# Patient Record
Sex: Female | Born: 1955 | Race: White | Hispanic: No | Smoking: Current every day smoker
Health system: Southern US, Community
[De-identification: ages and names within clinical notes are randomized; demographics above are authoritative.]

---

## 2019-11-13 ENCOUNTER — Other Ambulatory Visit: Payer: Self-pay

## 2019-11-13 ENCOUNTER — Emergency Department
Admission: EM | Admit: 2019-11-13 | Discharge: 2019-11-14 | Disposition: A | Discharge status: Other Acute Inpatient Hospital | Payer: No Typology Code available for payment source | Attending: Emergency Medicine | Admitting: Emergency Medicine

## 2019-11-13 DIAGNOSIS — F332 Major depressive disorder, recurrent severe without psychotic features: Secondary | ICD-10-CM | POA: Diagnosis not present

## 2019-11-13 DIAGNOSIS — R45851 Suicidal ideations: Secondary | ICD-10-CM | POA: Insufficient documentation

## 2019-11-13 DIAGNOSIS — Z20822 Contact with and (suspected) exposure to covid-19: Secondary | ICD-10-CM | POA: Insufficient documentation

## 2019-11-13 DIAGNOSIS — F419 Anxiety disorder, unspecified: Secondary | ICD-10-CM | POA: Insufficient documentation

## 2019-11-13 DIAGNOSIS — F329 Major depressive disorder, single episode, unspecified: Secondary | ICD-10-CM | POA: Insufficient documentation

## 2019-11-13 DIAGNOSIS — F32A Depression, unspecified: Secondary | ICD-10-CM

## 2019-11-13 LAB — SARS CORONAVIRUS 2 BY RT PCR (HOSPITAL ORDER, PERFORMED IN ~~LOC~~ HOSPITAL LAB): SARS Coronavirus 2: NEGATIVE

## 2019-11-13 LAB — COMPREHENSIVE METABOLIC PANEL
ALT: 11 U/L (ref 0–44)
AST: 15 U/L (ref 15–41)
Albumin: 4.9 g/dL (ref 3.5–5.0)
Alkaline Phosphatase: 57 U/L (ref 38–126)
Anion gap: 11 (ref 5–15)
BUN: 8 mg/dL (ref 8–23)
CO2: 22 mmol/L (ref 22–32)
Calcium: 9.4 mg/dL (ref 8.9–10.3)
Chloride: 102 mmol/L (ref 98–111)
Creatinine, Ser: 0.75 mg/dL (ref 0.44–1.00)
GFR calc Af Amer: >60 mL/min (ref >60)
GFR calc non Af Amer: >60 mL/min (ref >60)
Glucose, Bld: 104 mg/dL — ABNORMAL HIGH (ref 70–99)
Potassium: 3.7 mmol/L (ref 3.5–5.1)
Sodium: 135 mmol/L (ref 135–145)
Total Bilirubin: 0.8 mg/dL (ref 0.3–1.2)
Total Protein: 8.1 g/dL (ref 6.5–8.1)

## 2019-11-13 LAB — CBC
HCT: 42.1 % (ref 36.0–46.0)
Hemoglobin: 14.7 g/dL (ref 12.0–15.0)
MCH: 31.5 pg (ref 26.0–34.0)
MCHC: 34.9 g/dL (ref 30.0–36.0)
MCV: 90.3 fL (ref 80.0–100.0)
Platelets: 284 10*3/uL (ref 150–400)
RBC: 4.66 MIL/uL (ref 3.87–5.11)
RDW: 13.3 % (ref 11.5–15.5)
WBC: 7.1 10*3/uL (ref 4.0–10.5)
nRBC: 0 % (ref 0.0–0.2)

## 2019-11-13 LAB — ACETAMINOPHEN LEVEL: Acetaminophen (Tylenol), Serum: <10 ug/mL — ABNORMAL LOW (ref 10–30)

## 2019-11-13 LAB — SALICYLATE LEVEL: Salicylate Lvl: <7 mg/dL — ABNORMAL LOW (ref 7.0–30.0)

## 2019-11-13 LAB — ETHANOL: Alcohol, Ethyl (B): <10 mg/dL (ref <10)

## 2019-11-13 MED ORDER — FLUOXETINE HCL 20 MG PO CAPS
40.0000 mg | ORAL_CAPSULE | Freq: Every day | ORAL | Status: DC
  Administered 2019-11-13 – 2019-11-14 (×2): 40 mg via ORAL
  Filled 2019-11-13 (×2): qty 2

## 2019-11-13 NOTE — ED Notes (Signed)
Pt denies HI/AVH but endorses SI but no plan. States she has been feeling increasingly overwhelmed since she came here to help out her son. Pt reports she is from [place]. Reports hx of depression but has not been taking meds and did admit that the medications she last received from the [place], she threw them out. Pt very anxious on assessment. Awaiting psych consult.

## 2019-11-13 NOTE — ED Notes (Signed)
Belongings include gray tshirt, white pants, two flip flops, one bra, one pair underwear, one purse with phone, wallet, keys inside. Pt has one pair glasses that are staying with patient.

## 2019-11-13 NOTE — Consult Note (Signed)
[facility] Face-to-Face [facility] Consult   Reason for Consult:  Suicidal ideations Referring Physician:  EDP Patient Identification: Terri Chandler MRN:  [id number] Principal Diagnosis: Major depressive disorder, recurrent severe without psychotic features (HCC) Diagnosis:  Principal Problem:   Major depressive disorder, recurrent severe without psychotic features (HCC)  Total Time spent with patient: 45 minutes  Subjective:   Terri Chandler is a 64 y.o. female patient admitted with suicidal ideations and plan to overdose.  Patient seen and evaluated in person by this provider.  She reports her depression became worse over the past week to the point of suicidal ideations with a plan to overdose.  Past overdose attempts with [facility] in [place] and [place].  She moved to  last week and is living in a hospital.  Stressed with her son's break up of his relationship with a girlfriend which made her depression worse as she cannot deal with it.  Memory is poor at times, repetitive on assessment at times.  Tearful with feelings of hopelessness, helplessness, and worthlessness.  No homicidal ideations, hallucinations, substance use, mania, or other associated factors.  A[facility]able [facility] or elsewhere.  Depression 10/10 and is worse at night.  HPI per [facility], Jasmine Faulcon: Terri Chandler is an 64 y.o. female. Pt presented to the ED voluntarily, with c/o SI without a plan.  Patient was severely anxious, with a flat affect upon interview. When asked what brought her to the ED the patient stated, "Everything in my life's been uprooted. I ended up here." Patient explained that she'd moved to [facility] from [place] a couple of weeks ago and currently lives in a hotel with her son, her dog, and her cat.Patient reported an extensive hx of depression and previous intentional overdoses that resulted in her being hospitalized. The patient continued to ruminate about  her son and his girlfriend breaking up throughout the interview. The patient reported that she stopped taking her depression medications due to being overwhelmed with her son's situation. The patient became visibly distressed while explaining her current circumstances. The patient expressed her need to contact her son to let him know where she was but could not remember his phone number. The patient expanded by reporting that her memory has not been intact lately. Patient endorsed SI however she stated she did not have a specific plan to harm herself. The patient expressed that she is experiencing feelings of overwhelm, hopelessness, and feelings of embarrassment about her situation. Denies any hallucinations, homicidal ideations, or paranoia. The patient agreed with being admitted inpatient and stated that she needs help before things get worse.   Past Psychiatric History: depression, anxiety  Risk to Self:  yes Risk to Others:  none [facility]  Past Medical History: History reviewed. No pertinent past medical history.  Family History: History reviewed. No pertinent family history. Family Psychiatric  History: None Social History:  Social History   Substance and Sexual Activity  Alcohol Use None     Social History   Substance and Sexual Activity  Drug Use Not on file    Social History   Socioeconomic History  . Marital status: Not on file    Spouse name: Not on file  . Number of children: Not on file  . Years of education: Not on file  . Highest education level: Not on file  Occupational History  . Not on file  Tobacco Use  . Smoking status:  Not on file  Substance and Sexual Activity  . Alcohol use: Not on file  . Drug use: Not on file  . Sexual activity: Not on file  Other Topics Concern  . Not on file  Social History Narrative  . Not on file   Social Determinants of Health   Financial Resource Strain:   . Difficulty of  Paying Living Expenses:   Food Insecurity:   . Worried About Programme researcher, broadcasting/film/video in the Last Year:   . Barista in the Last Year:   Transportation Needs:   . Freight forwarder (Medical):   Marland Kitchen Lack of Transportation (Non-Medical):   Physical Activity:   . Days of Exercise per Week:   . Minutes of Exercise per Session:   Stress:   . Feeling of Stress :   Social Connections:   . Frequency of Communication with Friends and Family:   . Frequency of Social Gatherings with Friends and Family:   . Attends Religious Services:   . Active Member of Clubs or Organizations:   . Attends Banker Meetings:   Marland Kitchen Marital Status:    Additional Social History:    Allergies:  Not on File  Labs:  Results for orders placed or performed during the hospital encounter of 11/13/19 (from the past 48 hour(s))  Comprehensive metabolic panel     Status: Abnormal   Collection Time: 11/13/19  4:04 PM  Result Value Ref Range   Sodium 135 135 - 145 mmol/L   Potassium 3.7 3.5 - 5.1 mmol/L   Chloride 102 98 - 111 mmol/L   CO2 22 22 - 32 mmol/L   Glucose, Bld 104 (H) 70 - 99 mg/dL    Comment: Glucose reference range applies only to samples taken after fasting for at least 8 hours.   BUN 8 8 - 23 mg/dL   Creatinine, Ser 0.73 0.44 - 1.00 mg/dL   Calcium 9.4 8.9 - 71.0 mg/dL   Total Protein 8.1 6.5 - 8.1 g/dL   Albumin 4.9 3.5 - 5.0 g/dL   AST 15 15 - 41 U/L   ALT 11 0 - 44 U/L   Alkaline Phosphatase 57 38 - 126 U/L   Total Bilirubin 0.8 0.3 - 1.2 mg/dL   GFR calc non Af Amer >60 >60 mL/min   GFR calc Af Amer >60 >60 mL/min   Anion gap 11 5 - 15    Comment: Performed at [facility], [address], [place], [place] [address]  Ethanol     Status: None   Collection Time: 11/13/19  4:04 PM  Result Value Ref Range   Alcohol, Ethyl (B) <10 <10 mg/dL    Comment: (NOTE) Lowest detectable limit for serum alcohol is 10 mg/dL.  For medical purposes only. Performed at [facility], [address] [place], [place] [address]   Salicylate level     Status: Abnormal   Collection Time: 11/13/19  4:04 PM  Result Value Ref Range   Salicylate Lvl <7.0 (L) 7.0 - 30.0 mg/dL    Comment: Performed at [facility], [address] [place], [place] [address]  Acetaminophen level     Status: Abnormal   Collection Time: 11/13/19  4:04 PM  Result Value Ref Range   Acetaminophen (Tylenol), Serum <10 (L) 10 - 30 ug/mL    Comment: (NOTE) Therapeutic concentrations vary significantly. A range of 10-30 ug/mL  may be an effective concentration for many patients. However,  some  are best treated at concentrations outside of this range. Acetaminophen concentrations >150 ug/mL at 4 hours after ingestion  and >50 ug/mL at 12 hours after ingestion are often associated with  toxic reactions.  Performed at [facility], [address] [place], [place] [address]   cbc     Status: None   Collection Time: 11/13/19  4:04 PM  Result Value Ref Range   WBC 7.1 4.0 - 10.5 K/uL   RBC 4.66 3.87 - 5.11 MIL/uL   Hemoglobin 14.7 12.0 - 15.0 g/dL   HCT 35.5 36 - 46 %   MCV 90.3 80.0 - 100.0 fL   MCH 31.5 26.0 - 34.0 pg   MCHC 34.9 30.0 - 36.0 g/dL   RDW 97.4 16.3 - 84.5 %   Platelets 284 150 - 400 K/uL   nRBC 0.0 0.0 - 0.2 %    Comment: Performed at [facility], [address] [place], [place] [address]    No current facility-administered medications for this encounter.   No current outpatient medications on file.    Musculoskeletal: Strength & Muscle Tone: within normal limits Gait & Station: normal Patient leans: N/A  Psychiatric Specialty Exam: Physical Exam Vitals and nursing note reviewed.  Constitutional:      Appearance: Normal appearance.  HENT:     Head: Normocephalic.     Nose: Nose normal.  Pulmonary:     Effort: Pulmonary effort is normal.  Musculoskeletal:        General: Normal range of motion.     Cervical back:  Normal range of motion.  Neurological:     General: No focal deficit present.     Mental Status: She is alert and oriented to person, place, and time.  Psychiatric:        Attention and Perception: Attention normal.        Mood and Affect: Mood is anxious and depressed.        Speech: Speech normal.        Behavior: Behavior normal. Behavior is cooperative.        Thought Content: Thought content includes suicidal ideation. Thought content includes suicidal plan.        Cognition and Memory: Cognition and memory normal.        Judgment: Judgment normal.     Review of Systems  Psychiatric/Behavioral: Positive for dysphoric mood and suicidal ideas.  All other systems reviewed and are negative.   Blood pressure 95/68, pulse 89, temperature 99.1 F (37.3 C), temperature source Oral, resp. rate 20, height 5\' 7"  (1.702 m), weight 61.2 kg, SpO2 97 %.Body mass index is 21.14 kg/m.  General Appearance: Casual  Eye Contact:  Fair  Speech:  Normal Rate  Volume:  Normal  Mood:  Anxious and Depressed  Affect:  Congruent  Thought Process:  Coherent and Descriptions of Associations: Intact  Orientation:  Full (Time, Place, and Person)  Thought Content:  Rumination  Suicidal Thoughts:  Yes.  with intent/plan  Homicidal Thoughts:  No  Memory:  Immediate;   Fair Recent;   Poor Remote;   Fair  Judgement:  Fair  Insight:  Fair  Psychomotor Activity:  Decreased  Concentration:  Concentration: Fair and Attention Span: Fair  Recall:  of Knowledge:  Fair  Language:  Good  Akathisia:  No  Handed:  Right  AIMS (if indicated):     Assets:  Leisure Time Physical Health Resilience Social Support  ADL's:  Intact  Cognition:  Impaired,  Mild  Sleep:        Treatment Plan Summary: Daily contact with patient to assess and evaluate symptoms and progress in treatment, Medication management and Plan major depressive disorder, recurrent, severe without psychosis:  -Restarted Zoloft 50   Mg daily -Admit to inpatient at [facility] behavioral health unit  Disposition: Recommend psychiatric Inpatient admission when medically cleared.  Nanine Means, NP 11/13/2019 5:23 PM
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
## 2019-11-13 NOTE — ED Notes (Signed)
Pt given dinner tray at this time; no other needs voiced. Will continue to monitor Q15 minute rounds.

## 2019-11-13 NOTE — ED Notes (Signed)
Pt talking to psych NP and TTS at this time

## 2019-11-13 NOTE — ED Triage Notes (Signed)
Pt comes EMS from home with SI. Pt states a lot of things going on at home with son and his ex gf. Pt states active thoughts of SI with hx of depression. Denies a plan.

## 2019-11-13 NOTE — ED Notes (Signed)
ED Provider at bedside. 

## 2019-11-13 NOTE — ED Notes (Signed)

## 2019-11-13 NOTE — ED Provider Notes (Signed)
[facility] Emergency Department Provider Note  ____________________________________________   First MD Initiated Contact with Patient 11/13/19 1619     (approximate)  I have reviewed the triage vital signs and the nursing notes.   HISTORY  Chief Complaint Psychiatric Evaluation   HPI Terri Chandler is a 64 y.o. female a past medical history of anxiety depression who presents for assessment approximately 1 week of worsening SI and anxiety.  Patient endorses multiple recent stressors including her son moving in with her difficulty managing her stress taking care of her son and 2 pet pets.  She states she is not currently on any medication all has been treated for anxiety in the past.  She does not endorse a clear plan but when asked fetal plan to hurt her self she states "I just do not feel safe and I am overwhelmed."  She denies any other clear alleviating or aggravating factors.  Denies HI or hallucinations.  Denies any fevers, cough, chills, vomiting, diarrhea, dysuria, recent injuries or falls, or other acute complaints.         History reviewed. No pertinent past medical history.  Patient Active Problem List   Diagnosis Date Noted  . Major depressive disorder, recurrent severe without psychotic features (HCC) 11/13/2019      Prior to Admission medications   Not on File    Allergies Patient has no allergy information on record.  History reviewed. No pertinent family history.  Social History Social History   Tobacco Use  . Smoking status: Not on file  Substance Use Topics  . Alcohol use: Not on file  . Drug use: Not on file    Review of Systems  Review of Systems  Constitutional: Negative for chills and fever.  HENT: Negative for sore throat.   Eyes: Negative for pain.  Respiratory: Negative for cough and stridor.   Cardiovascular: Negative for chest pain.  Gastrointestinal: Negative for vomiting.  Skin: Negative for rash.   Neurological: Negative for seizures, loss of consciousness and headaches.  Psychiatric/Behavioral: Positive for depression and suicidal ideas. The patient is nervous/anxious.   All other systems reviewed and are negative.     ____________________________________________   PHYSICAL EXAM:  VITAL SIGNS: ED Triage Vitals  Enc Vitals Group     BP 11/13/19 1557 95/68     Pulse Rate 11/13/19 1557 89     Resp 11/13/19 1557 20     Temp 11/13/19 1557 99.1 F (37.3 C)     Temp Source 11/13/19 1557 Oral     SpO2 11/13/19 1557 97 %     Weight 11/13/19 1601 135 lb (61.2 kg)     Height 11/13/19 1601 5\' 7"  (1.702 m)     Head Circumference --      Peak Flow --      Pain Score 11/13/19 1601 0     Pain Loc --      Pain Edu? --      Excl. in GC? --    Vitals:   11/13/19 1557  BP: 95/68  Pulse: 89  Resp: 20  Temp: 99.1 F (37.3 C)  SpO2: 97%   Physical Exam Vitals and nursing note reviewed.  Constitutional:      General: She is not in acute distress.    Appearance: She is well-developed.  HENT:     Head: Normocephalic and atraumatic.     Right Ear: External ear normal.     Left Ear: External ear normal.  Nose: Nose normal.     Mouth/Throat:     Mouth: Mucous membranes are moist.  Eyes:     Conjunctiva/sclera: Conjunctivae normal.  Cardiovascular:     Rate and Rhythm: Normal rate and regular rhythm.     Heart sounds: No murmur heard.   Pulmonary:     Effort: Pulmonary effort is normal. No respiratory distress.     Breath sounds: Normal breath sounds.  Abdominal:     Palpations: Abdomen is soft.     Tenderness: There is no abdominal tenderness.  Musculoskeletal:     Cervical back: Neck supple.  Skin:    General: Skin is warm and dry.     Capillary Refill: Capillary refill takes less than 2 seconds.  Neurological:     Mental Status: She is alert and oriented to person, place, and time.  Psychiatric:        Mood and Affect: Mood is anxious and depressed.         Thought Content: Thought content includes suicidal ideation. Thought content does not include homicidal ideation. Thought content does not include homicidal or suicidal plan.      ____________________________________________   LABS (all labs ordered are listed, but only abnormal results are displayed)  Labs Reviewed  COMPREHENSIVE METABOLIC PANEL - Abnormal; Notable for the following components:      Result Value   Glucose, Bld 104 (*)    All other components within normal limits  SALICYLATE LEVEL - Abnormal; Notable for the following components:   Salicylate Lvl <7.0 (*)    All other components within normal limits  ACETAMINOPHEN LEVEL - Abnormal; Notable for the following components:   Acetaminophen (Tylenol), Serum <10 (*)    All other components within normal limits  SARS CORONAVIRUS 2 BY RT PCR (HOSPITAL ORDER, PERFORMED IN [facility])  ETHANOL  CBC  URINE DRUG SCREEN, QUALITATIVE ([facility] ONLY)   ____________________________________________  ____________________________________________   PROCEDURES  Procedure(s) performed (including Critical Care):  Procedures   ____________________________________________   INITIAL IMPRESSION / ASSESSMENT AND PLAN / ED COURSE        Patient presents for assessment of depression and suicidal ideation as described above.  No evidence of acute psychosis or HI.  Patient is afebrile hemodynamically stable on arrival.  Routine psych labs obtained as noted above to assess for other etiologies for patient's depression and suicidality.  Psychiatry service consulted to make recommendations.  Initial history, exam, and work-up is not consistent with infectious etiology, CVA, toxic ingestion, or other nonpsychiatric cause.  The patient has been placed in psychiatric observation due to the need to provide a safe environment for the patient while obtaining psychiatric consultation and evaluation, as well as ongoing medical and  medication management to treat the patient's condition.  The patient has been placed under full IVC at this time.          ____________________________________________   FINAL CLINICAL IMPRESSION(S) / ED DIAGNOSES  Final diagnoses:  None     ED Discharge Orders    None       Note:  This document was prepared using Dragon voice recognition software and may include unintentional dictation errors.   Gilles Chiquito, MD 11/13/19 [phone]

## 2019-11-14 ENCOUNTER — Other Ambulatory Visit: Payer: Self-pay

## 2019-11-14 ENCOUNTER — Inpatient Hospital Stay
Admission: AD | Admit: 2019-11-14 | Discharge: 2019-11-19 | DRG: 885 | Disposition: A | Discharge status: Home / Self Care | Payer: No Typology Code available for payment source | Source: Intra-hospital | Attending: Psychiatry | Admitting: Psychiatry

## 2019-11-14 ENCOUNTER — Encounter: Payer: Self-pay | Admitting: Psychiatry

## 2019-11-14 DIAGNOSIS — R4182 Altered mental status, unspecified: Secondary | ICD-10-CM

## 2019-11-14 DIAGNOSIS — Z20822 Contact with and (suspected) exposure to covid-19: Secondary | ICD-10-CM | POA: Diagnosis present

## 2019-11-14 DIAGNOSIS — F321 Major depressive disorder, single episode, moderate: Secondary | ICD-10-CM | POA: Diagnosis not present

## 2019-11-14 DIAGNOSIS — F411 Generalized anxiety disorder: Secondary | ICD-10-CM | POA: Diagnosis present

## 2019-11-14 DIAGNOSIS — F17200 Nicotine dependence, unspecified, uncomplicated: Secondary | ICD-10-CM | POA: Diagnosis present

## 2019-11-14 DIAGNOSIS — G309 Alzheimer's disease, unspecified: Secondary | ICD-10-CM | POA: Diagnosis present

## 2019-11-14 DIAGNOSIS — F332 Major depressive disorder, recurrent severe without psychotic features: Secondary | ICD-10-CM | POA: Insufficient documentation

## 2019-11-14 DIAGNOSIS — Z915 Personal history of self-harm: Secondary | ICD-10-CM | POA: Diagnosis not present

## 2019-11-14 DIAGNOSIS — R45851 Suicidal ideations: Secondary | ICD-10-CM | POA: Diagnosis present

## 2019-11-14 DIAGNOSIS — F331 Major depressive disorder, recurrent, moderate: Principal | ICD-10-CM | POA: Diagnosis present

## 2019-11-14 DIAGNOSIS — F0390 Unspecified dementia without behavioral disturbance: Secondary | ICD-10-CM

## 2019-11-14 LAB — URINE DRUG SCREEN, QUALITATIVE (ARMC ONLY)
Amphetamines, Ur Screen: NOT DETECTED
Barbiturates, Ur Screen: NOT DETECTED
Benzodiazepine, Ur Scrn: NOT DETECTED
Cannabinoid 50 Ng, Ur ~~LOC~~: NOT DETECTED
Cocaine Metabolite,Ur ~~LOC~~: NOT DETECTED
MDMA (Ecstasy)Ur Screen: NOT DETECTED
Methadone Scn, Ur: NOT DETECTED
Opiate, Ur Screen: NOT DETECTED
Phencyclidine (PCP) Ur S: NOT DETECTED
Tricyclic, Ur Screen: NOT DETECTED

## 2019-11-14 MED ORDER — ALUM & MAG HYDROXIDE-SIMETH 200-200-20 MG/5ML PO SUSP: 30.0000 mL | ORAL | Status: DC | PRN

## 2019-11-14 MED ORDER — ACETAMINOPHEN 325 MG PO TABS: 650.0000 mg | ORAL_TABLET | Freq: Four times a day (QID) | ORAL | Status: DC | PRN

## 2019-11-14 MED ORDER — NICOTINE 14 MG/24HR TD PT24
14.0000 mg | MEDICATED_PATCH | Freq: Once | TRANSDERMAL | Status: DC
  Administered 2019-11-14: 14 mg via TRANSDERMAL
  Filled 2019-11-14: qty 1

## 2019-11-14 MED ORDER — NICOTINE 21 MG/24HR TD PT24
21.0000 mg | MEDICATED_PATCH | Freq: Every day | TRANSDERMAL | Status: DC
  Administered 2019-11-15 – 2019-11-19 (×5): 21 mg via TRANSDERMAL
  Filled 2019-11-14 (×4): qty 1

## 2019-11-14 MED ORDER — MAGNESIUM HYDROXIDE 400 MG/5ML PO SUSP: 30.0000 mL | Freq: Every day | ORAL | Status: DC | PRN

## 2019-11-14 NOTE — Progress Notes (Signed)
Patient pleasant and calm on the unit. Patient denies SI/HI/AVH and pain. Patient endorses anxiety and depression. Patient didn't have any scheduled medications and didn't request anything PRN. Patient observed interacting appropriately with staff and peers on the unit. Patient. Patient given education, support and encouragement to be active in her treatment plan. Patient being monitored Q 15 minutes for safety per unit protocol. Patient remains safe on the unit.

## 2019-11-14 NOTE — Tx Team (Signed)
Initial Treatment Plan 11/14/2019 5:02 PM Terri Chandler XNT:[id number]    PATIENT STRESSORS: Loss of brother and sister Medication change or noncompliance Traumatic event   PATIENT STRENGTHS: Average or above average intelligence Capable of independent living Communication skills Motivation for treatment/growth Supportive family/friends   PATIENT IDENTIFIED PROBLEMS: Loneliness   Depression  Anxiety                 DISCHARGE CRITERIA:  Ability to meet basic life and health needs Adequate post-discharge living arrangements Medical problems require only outpatient monitoring Verbal commitment to aftercare and medication compliance  PRELIMINARY DISCHARGE PLAN: Attend aftercare/continuing care group Return to previous living arrangement  PATIENT/FAMILY INVOLVEMENT: This treatment plan has been presented to and reviewed with the patient, Terri Chandler, and/or family member,   The patient and family have been given the opportunity to ask questions and make suggestions.  Leonarda Salon, RN 11/14/2019, 5:02 PM

## 2019-11-14 NOTE — Progress Notes (Signed)
Patient pleasant and cooperative during admission assessment.Patient stated that her main stress is her family is not around.Patient verbalized her memory loss multiple times.Patient is oriented to person,place and time. Patient denies SI/HI at this time. Patient denies AVH. Patient informed of fall risk status, fall risk assessed "low" at this time. Patient oriented to unit/staff/room. Patient denies any questions/concerns at this time. Patient safe on unit with Q15 minute checks for safety.Skin assessment and body search done.No contraband found.

## 2019-11-14 NOTE — BH Assessment (Signed)
This writer spoke with Hattie Perch [phone] EXT of the [facility] [place] [id number] about pt's referral. Corrie Dandy was unable to locate [facility] in the system.

## 2019-11-14 NOTE — ED Notes (Signed)
Patient went to restroom 

## 2019-11-14 NOTE — BH Assessment (Signed)
Patient is to be admitted to [facility] by Psychiatric Nurse Practitioner Nanine Means.  Attending Physician will be Dr. Toni Amend.   Patient has been assigned to room 304, by [facility] Charge Nurse Gigi.   Intake Paper Work has been signed and placed on patient chart.  ER staff is aware of the admission:  Nitchia, ER Secretary    Dr. Roxan Hockey, ER MD   Chrissie Noa, Patient's Nurse   Barkley Boards, Patient Access.  Expected admit time 1:30pm.

## 2019-11-14 NOTE — BHH Suicide Risk Assessment (Signed)
[facility] Admission Suicide Risk Assessment   Nursing information obtained from:    Demographic factors:    Current Mental Status:    Loss Factors:    Historical Factors:    Risk Reduction Factors:     Total Time spent with patient: 1 hour Principal Problem: Episode of moderate major depression (HCC) Diagnosis:  Principal Problem:   Episode of moderate major depression (HCC) Active Problems:   Dementia (HCC)  Subjective Data: Patient seen chart reviewed.  64 year old woman who presented to the emergency room complaining of depression.  In the emergency room it was documented that she had made some suicidal statements but on interview with me the patient denies suicidal ideation intent or plan.  Denies psychotic symptoms.  Endorses extremely vague symptoms of depression and anxiety but emphasizes her memory problems.  Multiple social problems from what I can determine recent relocation from the [place] to live with one of her children here locally.  Continued Clinical Symptoms:  Alcohol Use Disorder Identification Test Final Score (AUDIT): 1 The "Alcohol Use Disorders Identification Test", Guidelines for Use in Primary Care, Second Edition.  World Science writer [facility]). Score between 0-7:  no or low risk or alcohol related problems. Score between 8-15:  moderate risk of alcohol related problems. Score between 16-19:  high risk of alcohol related problems. Score 20 or above:  warrants further diagnostic evaluation for alcohol dependence and treatment.   CLINICAL FACTORS:   Depression:   Anhedonia   Musculoskeletal: Strength & Muscle Tone: within normal limits Gait & Station: normal Patient leans: N/A  Psychiatric Specialty Exam: Physical Exam Vitals and nursing note reviewed.  Constitutional:      Appearance: She is well-developed.  HENT:     Head: Normocephalic and atraumatic.  Eyes:     Conjunctiva/sclera: Conjunctivae normal.     Pupils: Pupils are equal, round, and  reactive to light.  Cardiovascular:     Heart sounds: Normal heart sounds.  Pulmonary:     Effort: Pulmonary effort is normal.  Abdominal:     Palpations: Abdomen is soft.  Musculoskeletal:        General: Normal range of motion.     Cervical back: Normal range of motion.  Skin:    General: Skin is warm and dry.  Neurological:     General: No focal deficit present.     Mental Status: She is alert.  Psychiatric:        Attention and Perception: She is inattentive.        Mood and Affect: Mood is anxious.        Speech: Speech is tangential.        Behavior: Behavior is slowed. Behavior is not agitated or aggressive.        Thought Content: Thought content is not paranoid or delusional. Thought content does not include homicidal or suicidal ideation.        Cognition and Memory: Cognition is impaired. Memory is impaired. She exhibits impaired recent memory and impaired remote memory.        Judgment: Judgment is inappropriate.     Review of Systems  Constitutional: Negative.   HENT: Negative.   Eyes: Negative.   Respiratory: Negative.   Cardiovascular: Negative.   Gastrointestinal: Negative.   Musculoskeletal: Negative.   Skin: Negative.   Neurological: Negative.   Psychiatric/Behavioral: Positive for confusion, decreased concentration and dysphoric mood. Negative for suicidal ideas.    Blood pressure 109/75, pulse 76, temperature 98.2 F (36.8 C), temperature  source Oral, resp. rate 18, height 5\' 7"  (1.702 m), weight 68.5 kg, SpO2 98 %.Body mass index is 23.65 kg/m.  General Appearance: Casual  Eye Contact:  Fair  Speech:  Halting and blocked at times  Volume:  Decreased  Mood:  Anxious and Dysphoric  Affect:  Constricted  Thought Process:  Disorganized  Orientation:  Negative  Thought Content:  Illogical, Rumination and Tangential  Suicidal Thoughts:  No  Homicidal Thoughts:  No  Memory:  Immediate;   Fair Recent;   Poor Remote;   Poor  Judgement:  Impaired   Insight:  Shallow  Psychomotor Activity:  Restlessness  Concentration:  Concentration: Poor  Recall:  Poor  Fund of Knowledge:  Poor  Language:  Poor  Akathisia:  No  Handed:  Right  AIMS (if indicated):     Assets:  Desire for Improvement  ADL's:  Impaired  Cognition:  Impaired,  Mild  Sleep:         COGNITIVE FEATURES THAT CONTRIBUTE TO RISK:  Thought constriction (tunnel vision)    SUICIDE RISK:   Minimal: No identifiable suicidal ideation.  Patients presenting with no risk factors but with morbid ruminations; may be classified as minimal risk based on the severity of the depressive symptoms  PLAN OF CARE: Patient had been admitted because of concern about depression but on interview today the larger concern seems to be a pretty remarkable degree of memory impairment and confusion.  She is denying suicidal thoughts.  She emphasizes multiple times that fluoxetine which she had recently been prescribed has been ineffective but cannot name any previous medicine that she has taken.  Plan will be to continue 15-minute checks.  Try to engage in individual and group counseling and assessment and get further information if possible from her family.  I certify that inpatient services furnished can reasonably be expected to improve the patient's condition.   , MD 11/14/2019, 3:50 PM

## 2019-11-14 NOTE — H&P (Signed)
Psychiatric Admission Assessment Adult  Patient Identification: Terri Chandler MRN:  [id number] Date of Evaluation:  11/14/2019 Chief Complaint:  Severe recurrent major depression without psychotic features (HCC) [F33.2] Principal Diagnosis: Episode of moderate major depression (HCC) Diagnosis:  Principal Problem:   Episode of moderate major depression (HCC) Active Problems:   Dementia (HCC)  History of Present Illness: Patient seen and chart reviewed.  64 year old woman who presented to the emergency room with initial complaints of depression symptoms.  In the emergency room it was documented that she was complaining of depression and feeling overwhelmed.  There was concern about some possible suicidal ideation.  On interview with me today the patient is a difficult historian.  She has a remarkable degree of memory impairment and confusion.  She tells me that she had only recently relocated to [facility].  At first she indicates that she recently moved here from [place] but later in the interview tells me that she just moved from [facility] and then later tells me that she most recently came from [place].  The exact circumstances seem unclear.  She tells me that she has been taking fluoxetine that was prescribed by her doctor back in the [place] and thinks that she has been taking it for at least a few months but not had any benefit.  Mood feels down and nervous.  Despite this she says her sleep and appetite are fine.  She denies having any suicidal thoughts intent or plan.  Denies homicidal ideation.  Denies psychotic symptoms.  She mentions multiple life stresses saying that several of her relatives including at least 2 sisters have died of cancer in the last few months.  Patient denies alcohol or drug abuse.  She seems rather confused about her current circumstance. Associated Signs/Symptoms: Depression Symptoms:  depressed mood, impaired memory, (Hypo) Manic Symptoms:   Impulsivity, Anxiety Symptoms:  Excessive Worry, Psychotic Symptoms:  None reported or evident PTSD Symptoms: Negative Total Time spent with patient: 1 hour  Past Psychiatric History: Patient evidently has had psychiatric treatment in the past although she is a very bad historian and has a hard time explaining it.  It remains unclear to me whether she has ever actually been admitted to a psychiatric hospital in the past.  She does say that she had a suicide attempt by an overdose sometime in the past but exactly how long ago is unclear.  She thinks that she has been on other medicines in the past besides Prozac but cannot remember a single one of them or whether they have been effective.  Patient repeats herself multiple times during the interview.  Seems to get disoriented easily.  She tells me that she has had multiple surgeries on her neck but denies ever having had a traumatic head injury  Is the patient at risk to self? Yes.    Has the patient been a risk to self in the past 6 months? Yes.    Has the patient been a risk to self within the distant past? No.  Is the patient a risk to others? No.  Has the patient been a risk to others in the past 6 months? No.  Has the patient been a risk to others within the distant past? No.   Prior Inpatient Therapy:   Prior Outpatient Therapy:    Alcohol Screening: 1. How often do you have a drink containing alcohol?: Monthly or less 2. How many drinks containing alcohol do you have on a typical day when you are  drinking?: 1 or 2 3. How often do you have six or more drinks on one occasion?: Never AUDIT-C Score: 1 4. How often during the last year have you found that you were not able to stop drinking once you had started?: Never 5. How often during the last year have you failed to do what was normally expected from you because of drinking?: Never 6. How often during the last year have you needed a first drink in the morning to get yourself going after  a heavy drinking session?: Never 7. How often during the last year have you had a feeling of guilt of remorse after drinking?: Never 8. How often during the last year have you been unable to remember what happened the night before because you had been drinking?: Never 9. Have you or someone else been injured as a result of your drinking?: No 10. Has a relative or friend or a doctor or another health worker been concerned about your drinking or suggested you cut down?: No Alcohol Use Disorder Identification Test Final Score (AUDIT): 1 Alcohol Brief Interventions/Follow-up: AUDIT Score <7 follow-up not indicated Substance Abuse History in the last 12 months:  No. Consequences of Substance Abuse: Negative Previous Psychotropic Medications: Yes  Psychological Evaluations: Yes  Past Medical History: History reviewed. No pertinent past medical history. History reviewed. No pertinent surgical history. Family History: History reviewed. No pertinent family history. Family Psychiatric  History: Patient is not aware of any Tobacco Screening: Have you used any form of tobacco in the last 30 days? (Cigarettes, Smokeless Tobacco, Cigars, and/or Pipes): Yes Tobacco use, Select all that apply: 5 or more cigarettes per day Are you interested in Tobacco Cessation Medications?: Yes, will notify MD for an order Counseled patient on smoking cessation including recognizing danger situations, developing coping skills and basic information about quitting provided: Yes Social History:  Social History   Substance and Sexual Activity  Alcohol Use None     Social History   Substance and Sexual Activity  Drug Use Not on file    Additional Social History:                           Allergies:  Not on File Lab Results:  Results for orders placed or performed during the hospital encounter of 11/13/19 (from the past 48 hour(s))  Comprehensive metabolic panel     Status: Abnormal   Collection Time:  11/13/19  4:04 PM  Result Value Ref Range   Sodium 135 135 - 145 mmol/L   Potassium 3.7 3.5 - 5.1 mmol/L   Chloride 102 98 - 111 mmol/L   CO2 22 22 - 32 mmol/L   Glucose, Bld 104 (H) 70 - 99 mg/dL    Comment: Glucose reference range applies only to samples taken after fasting for at least 8 hours.   BUN 8 8 - 23 mg/dL   Creatinine, Ser 6.28 0.44 - 1.00 mg/dL   Calcium 9.4 8.9 - 31.5 mg/dL   Total Protein 8.1 6.5 - 8.1 g/dL   Albumin 4.9 3.5 - 5.0 g/dL   AST 15 15 - 41 U/L   ALT 11 0 - 44 U/L   Alkaline Phosphatase 57 38 - 126 U/L   Total Bilirubin 0.8 0.3 - 1.2 mg/dL   GFR calc non Af Amer >60 >60 mL/min   GFR calc Af Amer >60 >60 mL/min   Anion gap 11 5 - 15    Comment: Performed  at  [facility], [address] [place], [place] [address]  Ethanol     Status: None   Collection Time: 11/13/19  4:04 PM  Result Value Ref Range   Alcohol, Ethyl (B) <10 <10 mg/dL    Comment: (NOTE) Lowest detectable limit for serum alcohol is 10 mg/dL.  For medical purposes only. Performed at [facility], [address] [place], [place] [address]   Salicylate level     Status: Abnormal   Collection Time: 11/13/19  4:04 PM  Result Value Ref Range   Salicylate Lvl <7.0 (L) 7.0 - 30.0 mg/dL    Comment: Performed at [facility], [address] [place], [place] [address]  Acetaminophen level     Status: Abnormal   Collection Time: 11/13/19  4:04 PM  Result Value Ref Range   Acetaminophen (Tylenol), Serum <10 (L) 10 - 30 ug/mL    Comment: (NOTE) Therapeutic concentrations vary significantly. A range of 10-30 ug/mL  may be an effective concentration for many patients. However, some  are best treated at concentrations outside of this range. Acetaminophen concentrations >150 ug/mL at 4 hours after ingestion  and >50 ug/mL at 12 hours after ingestion are often associated with  toxic reactions.  Performed at [facility], [address]  [place], [place] [address]   cbc     Status: None   Collection Time: 11/13/19  4:04 PM  Result Value Ref Range   WBC 7.1 4.0 - 10.5 K/uL   RBC 4.66 3.87 - 5.11 MIL/uL   Hemoglobin 14.7 12.0 - 15.0 g/dL   HCT 29.542.1 36 - 46 %   MCV 90.3 80.0 - 100.0 fL   MCH 31.5 26.0 - 34.0 pg   MCHC 34.9 30.0 - 36.0 g/dL   RDW 62.113.3 30.811.5 - 65.715.5 %   Platelets 284 150 - 400 K/uL   nRBC 0.0 0.0 - 0.2 %    Comment: Performed at [facility], [address] [place], [place] [address]  Urine Drug Screen, Qualitative     Status: None   Collection Time: 11/13/19  4:04 PM  Result Value Ref Range   Tricyclic, Ur Screen NONE DETECTED NONE DETECTED   Amphetamines, Ur Screen NONE DETECTED NONE DETECTED   MDMA (Ecstasy)Ur Screen NONE DETECTED NONE DETECTED   Cocaine Metabolite,Ur [place] NONE DETECTED NONE DETECTED   Opiate, Ur Screen NONE DETECTED NONE DETECTED   Phencyclidine (PCP) Ur S NONE DETECTED NONE DETECTED   Cannabinoid 50 Ng, Ur  NONE DETECTED NONE DETECTED   Barbiturates, Ur Screen NONE DETECTED NONE DETECTED   Benzodiazepine, Ur Scrn NONE DETECTED NONE DETECTED   Methadone Scn, Ur NONE DETECTED NONE DETECTED    Comment: (NOTE) Tricyclics + metabolites, urine    Cutoff 1000 ng/mL Amphetamines + metabolites, urine  Cutoff 1000 ng/mL MDMA (Ecstasy), urine              Cutoff 500 ng/mL Cocaine Metabolite, urine          Cutoff 300 ng/mL Opiate + metabolites, urine        Cutoff 300 ng/mL Phencyclidine (PCP), urine         Cutoff 25 ng/mL Cannabinoid, urine                 Cutoff 50 ng/mL Barbiturates + metabolites, urine  Cutoff 200 ng/mL Benzodiazepine, urine              Cutoff 200 ng/mL Methadone, urine  Cutoff 300 ng/mL  The urine drug screen provides only a preliminary, unconfirmed analytical test result and should not be used for non-medical purposes. Clinical consideration and professional judgment should be applied to any positive drug screen result due to  possible interfering substances. A more specific alternate chemical method must be used in order to obtain a confirmed analytical result. Gas chromatography / mass spectrometry (GC/MS) is the preferred confirm atory method. Performed at [facility], [address] [place], [place] [address]   SARS Coronavirus 2 by RT PCR (hospital order, performed in [facility] hospital lab) Nasopharyngeal Nasopharyngeal Swab     Status: None   Collection Time: 11/13/19  4:58 PM   Specimen: Nasopharyngeal Swab  Result Value Ref Range   SARS Coronavirus 2 NEGATIVE NEGATIVE    Comment: (NOTE) SARS-CoV-2 target nucleic acids are NOT DETECTED.  The SARS-CoV-2 RNA is generally detectable in upper and lower respiratory specimens during the acute phase of infection. The lowest concentration of SARS-CoV-2 viral copies this assay can detect is 250 copies / mL. A negative result does not preclude SARS-CoV-2 infection and should not be used as the sole basis for treatment or other patient management decisions.  A negative result may occur with improper specimen collection / handling, submission of specimen other than nasopharyngeal swab, presence of viral mutation(s) within the areas targeted by this assay, and inadequate number of viral copies (<250 copies / mL). A negative result must be combined with clinical observations, patient history, and epidemiological information.  Fact Sheet for Patients:   BoilerBrush.com.cy  Fact Sheet for Healthcare Providers: https://pope.com/  This test is not yet approved or  cleared by the [place] FDA and has been authorized for detection and/or diagnosis of SARS-CoV-2 by FDA under an Emergency Use Authorization (EUA).  This EUA will remain in effect (meaning this test can be used) for the duration of the COVID-19 declaration under Section 564(b)(1) of the Act, 21 U.S.C. section 360bbb-3(b)(1), unless the  authorization is terminated or revoked sooner.  Performed at [facility], [address] [place], [place] [address]     Blood Alcohol level:  Lab Results  Component Value Date   [facility] <10 11/13/2019    Metabolic Disorder Labs:  No results found for: HGBA1C, MPG No results found for: PROLACTIN No results found for: CHOL, TRIG, HDL, CHOLHDL, VLDL, LDLCALC  Current Medications: Current Facility-Administered Medications  Medication Dose Route Frequency Provider Last Rate Last Admin  . acetaminophen (TYLENOL) tablet 650 mg  650 mg Oral Q6H PRN Makaiah Terwilliger T, MD      . alum & mag hydroxide-simeth (MAALOX/MYLANTA) 200-200-20 MG/5ML suspension 30 mL  30 mL Oral Q4H PRN Xaidyn Kepner T, MD      . magnesium hydroxide (MILK OF MAGNESIA) suspension 30 mL  30 mL Oral Daily PRN Zael Shuman, Jackquline Denmark, MD       PTA Medications: No medications prior to admission.    Musculoskeletal: Strength & Muscle Tone: within normal limits Gait & Station: normal Patient leans: N/A  Psychiatric Specialty Exam: Physical Exam Constitutional:      Appearance: She is well-developed.  HENT:     Head: Normocephalic and atraumatic.  Eyes:     Conjunctiva/sclera: Conjunctivae normal.     Pupils: Pupils are equal, round, and reactive to light.  Cardiovascular:     Heart sounds: Normal heart sounds.  Pulmonary:     Effort: Pulmonary effort is normal.  Abdominal:     Palpations: Abdomen is  soft.  Musculoskeletal:        General: Normal range of motion.     Cervical back: Normal range of motion.  Skin:    General: Skin is warm and dry.  Neurological:     General: No focal deficit present.     Mental Status: She is alert.  Psychiatric:        Attention and Perception: She is inattentive.        Mood and Affect: Mood is anxious. Affect is blunt.        Speech: Speech is tangential.        Behavior: Behavior is agitated. Behavior is not aggressive or hyperactive.        Thought Content:  Thought content does not include homicidal or suicidal ideation.        Cognition and Memory: Cognition is impaired. Memory is impaired. She exhibits impaired recent memory and impaired remote memory.        Judgment: Judgment is inappropriate.     Review of Systems  Constitutional: Negative.   HENT: Negative.   Eyes: Negative.   Respiratory: Negative.   Cardiovascular: Negative.   Gastrointestinal: Negative.   Musculoskeletal: Negative.   Skin: Negative.   Neurological: Negative.   Psychiatric/Behavioral: Positive for confusion and decreased concentration.    Blood pressure 109/75, pulse 76, temperature 98.2 F (36.8 C), temperature source Oral, resp. rate 18, height 5\' 7"  (1.702 m), weight 68.5 kg, SpO2 98 %.Body mass index is 23.65 kg/m.  General Appearance: Casual  Eye Contact:  Fair  Speech:  Blocked  Volume:  Decreased  Mood:  Dysphoric  Affect:  Constricted  Thought Process:  Disorganized  Orientation:  Negative  Thought Content:  Illogical, Rumination and Tangential  Suicidal Thoughts:  No  Homicidal Thoughts:  No  Memory:  Immediate;   Fair Recent;   Poor Remote;   Poor  Judgement:  Impaired  Insight:  Shallow  Psychomotor Activity:  Decreased  Concentration:  Concentration: Poor  Recall:  Poor  Fund of Knowledge:  Poor  Language:  Poor  Akathisia:  No  Handed:  Right  AIMS (if indicated):     Assets:  Housing  ADL's:  Impaired  Cognition:  Impaired,  Mild  Sleep:       Treatment Plan Summary: Daily contact with patient to assess and evaluate symptoms and progress in treatment, Medication management and Plan 64 year old woman.  We do not have any old records available.  In the emergency room she was describing symptoms of depression.  To my interview now she is emphasizing more her memory problems.  She thinks they have been going on for months but of course is not certain.  She does admit to some problems with her mood but denies suicidal thoughts.  Does  not appear to be psychotic.  I did not do formal testing but she seems fairly impaired in her memory and easily confused.  Labs unremarkable patient is not currently suicidal does not appear to be psychotic.  At her request we will not continue fluoxetine but it is not clear that there is any indication for changing to another medicine given her current symptoms.  Unclear what is causing the underlying dementia.  I have called her son and left a message.  We will try to get some collateral information.  Meanwhile treatment team will evaluate and we will include her in activities and evaluation on the ward..  Observation Level/Precautions:  15 minute checks  Laboratory:  Chemistry  Profile  Psychotherapy:    Medications:    Consultations:    Discharge Concerns:    Estimated LOS:  Other:     Physician Treatment Plan for Primary Diagnosis: Episode of moderate major depression (HCC) Long Term Goal(s): Improvement in symptoms so as ready for discharge  Short Term Goals: Ability to demonstrate self-control will improve and Ability to maintain clinical measurements within normal limits will improve  Physician Treatment Plan for Secondary Diagnosis: Principal Problem:   Episode of moderate major depression (HCC) Active Problems:   Dementia (HCC)  Long Term Goal(s): Improvement in symptoms so as ready for discharge  Short Term Goals: Ability to maintain clinical measurements within normal limits will improve and Compliance with prescribed medications will improve  I certify that inpatient services furnished can reasonably be expected to improve the patient's condition.    Mordecai Rasmussen, MD 8/5/20213:55 PM

## 2019-11-14 NOTE — Progress Notes (Signed)
BRIEF PHARMACY NOTE   This patient attended and participated in Medication Management Group counseling led by [facility] staff pharmacist.  This interactive class reviews basic information about prescription medications and education on personal responsibility in medication management.  The class also includes general knowledge of 3 main classes of behavioral medications, including antipsychotics, antidepressants, and mood stabilizers.     Patient behavior was appropriate for group setting. Patient arrived at end of group discussion and was provided with handout.    Educational materials sourced from:  "Medication Do's and Don'ts" from [email or web address]   "Mental Health Medications" from [facility] [email or web address]     Tressie Ellis 11/14/2019 , 3:27 PM

## 2019-11-14 NOTE — BH Assessment (Signed)
Referral information for Psychiatric Hospitalization faxed to;    [facility] [phone])

## 2019-11-14 NOTE — Plan of Care (Signed)
Patient hasn't had time to progress, new to the unit today  Problem: Education: Goal: Knowledge of [facility] Education information/materials will improve Outcome: Not Progressing Goal: Emotional status will improve Outcome: Not Progressing Goal: Mental status will improve Outcome: Not Progressing Goal: Verbalization of understanding the information provided will improve Outcome: Not Progressing   Problem: Health Behavior/Discharge Planning: Goal: Identification of resources available to assist in meeting health care needs will improve Outcome: Not Progressing Goal: Compliance with treatment plan for underlying cause of condition will improve Outcome: Not Progressing   Problem: Physical Regulation: Goal: Ability to maintain clinical measurements within normal limits will improve Outcome: Not Progressing   Problem: Coping: Goal: Coping ability will improve Outcome: Not Progressing Goal: Will verbalize feelings Outcome: Not Progressing   Problem: Safety: Goal: Ability to disclose and discuss suicidal ideas will improve Outcome: Not Progressing Goal: Ability to identify and utilize support systems that promote safety will improve Outcome: Not Progressing

## 2019-11-15 MED ORDER — DONEPEZIL HCL 5 MG PO TABS
5.0000 mg | ORAL_TABLET | Freq: Every day | ORAL | Status: DC
  Administered 2019-11-15 – 2019-11-18 (×4): 5 mg via ORAL
  Filled 2019-11-15 (×5): qty 1

## 2019-11-15 MED ORDER — CITALOPRAM HYDROBROMIDE 20 MG PO TABS
20.0000 mg | ORAL_TABLET | Freq: Every day | ORAL | Status: DC
  Administered 2019-11-15 – 2019-11-19 (×5): 20 mg via ORAL
  Filled 2019-11-15 (×5): qty 1

## 2019-11-15 MED ORDER — HYDROXYZINE HCL 50 MG PO TABS
50.0000 mg | ORAL_TABLET | Freq: Four times a day (QID) | ORAL | Status: DC | PRN
  Administered 2019-11-15 (×3): 50 mg via ORAL
  Filled 2019-11-15 (×3): qty 1

## 2019-11-15 NOTE — Progress Notes (Signed)
[facility] MD Progress Note  11/15/2019 3:17 PM Terri Chandler  MRN:  [id number] Subjective: Patient seen chart reviewed.  Patient was distressed this morning.  She tells me that when she got up today she was feeling suicidal.  Her reasons for this are just a recap of the same anxiety she was having yesterday about feeling like she had ruined her son's life.  Patient did not act out to hurt her self.  Later in the afternoon has calm down a bit.  No intent to harm herself.  Denies any current hallucinations.  Still clearly having major memory problems.  I spoke with her son on the phone who tells me that the patient's memory problems have been progressing for over a year.  Patient has had several hospitalizations through the [place] system and [facility] [place].  Unclear what if any medicines have been helpful for her. Principal Problem: Episode of moderate major depression (HCC) Diagnosis: Principal Problem:   Episode of moderate major depression (HCC) Active Problems:   Dementia (HCC)  Total Time spent with patient: 30 minutes  Past Psychiatric History: Past history of depression and anxiety.  Worsening memory problems of somewhat unclear etiology  Past Medical History: History reviewed. No pertinent past medical history. History reviewed. No pertinent surgical history. Family History: History reviewed. No pertinent family history. Family Psychiatric  History: Patient reports that she had a grandmother who had early onset dementia Social History:  Social History   Substance and Sexual Activity  Alcohol Use None     Social History   Substance and Sexual Activity  Drug Use Not on file    Social History   Socioeconomic History  . Marital status: Divorced    Spouse name: Not on file  . Number of children: Not on file  . Years of education: Not on file  . Highest education level: Not on file  Occupational History  . Not on file  Tobacco Use  . Smoking status: Current  Every Day Smoker  . Smokeless tobacco: Never Used  Substance and Sexual Activity  . Alcohol use: Not on file  . Drug use: Not on file  . Sexual activity: Not on file  Other Topics Concern  . Not on file  Social History Narrative  . Not on file   Social Determinants of Health   Financial Resource Strain:   . Difficulty of Paying Living Expenses:   Food Insecurity:   . Worried About Programme researcher, broadcasting/film/video in the Last Year:   . Barista in the Last Year:   Transportation Needs:   . Freight forwarder (Medical):   Marland Kitchen Lack of Transportation (Non-Medical):   Physical Activity:   . Days of Exercise per Week:   . Minutes of Exercise per Session:   Stress:   . Feeling of Stress :   Social Connections:   . Frequency of Communication with Friends and Family:   . Frequency of Social Gatherings with Friends and Family:   . Attends Religious Services:   . Active Member of Clubs or Organizations:   . Attends Banker Meetings:   Marland Kitchen Marital Status:    Additional Social History:                         Sleep: Fair  Appetite:  Fair  Current Medications: Current Facility-Administered Medications  Medication Dose Route Frequency Provider Last Rate Last Admin  .  acetaminophen (TYLENOL) tablet 650 mg  650 mg Oral Q6H PRN Malachy Coleman T, MD      . alum & mag hydroxide-simeth (MAALOX/MYLANTA) 200-200-20 MG/5ML suspension 30 mL  30 mL Oral Q4H PRN Trevor Wilkie T, MD      . citalopram (CELEXA) tablet 20 mg  20 mg Oral Daily Poseidon Pam, Jackquline DenmarkJohn T, MD   20 mg at 11/15/19 1236  . donepezil (ARICEPT) tablet 5 mg  5 mg Oral QHS Dustan Hyams T, MD      . hydrOXYzine (ATARAX/VISTARIL) tablet 50 mg  50 mg Oral Q6H PRN Chester Sibert, Jackquline DenmarkJohn T, MD   50 mg at 11/15/19 0847  . magnesium hydroxide (MILK OF MAGNESIA) suspension 30 mL  30 mL Oral Daily PRN Javonnie Illescas T, MD      . nicotine (NICODERM CQ - dosed in mg/24 hours) patch 21 mg  21 mg Transdermal Daily Kayd Launer, Jackquline DenmarkJohn T, MD   21  mg at 11/15/19 [id number]    Lab Results:  Results for orders placed or performed during the hospital encounter of 11/13/19 (from the past 48 hour(s))  Comprehensive metabolic panel     Status: Abnormal   Collection Time: 11/13/19  4:04 PM  Result Value Ref Range   Sodium 135 135 - 145 mmol/L   Potassium 3.7 3.5 - 5.1 mmol/L   Chloride 102 98 - 111 mmol/L   CO2 22 22 - 32 mmol/L   Glucose, Bld 104 (H) 70 - 99 mg/dL    Comment: Glucose reference range applies only to samples taken after fasting for at least 8 hours.   BUN 8 8 - 23 mg/dL   Creatinine, Ser 5.360.75 0.44 - 1.00 mg/dL   Calcium 9.4 8.9 - 64.410.3 mg/dL   Total Protein 8.1 6.5 - 8.1 g/dL   Albumin 4.9 3.5 - 5.0 g/dL   AST 15 15 - 41 U/L   ALT 11 0 - 44 U/L   Alkaline Phosphatase 57 38 - 126 U/L   Total Bilirubin 0.8 0.3 - 1.2 mg/dL   GFR calc non Af Amer >60 >60 mL/min   GFR calc Af Amer >60 >60 mL/min   Anion gap 11 5 - 15    Comment: Performed at [facility], [address] [place], [place] [address]  Ethanol     Status: None   Collection Time: 11/13/19  4:04 PM  Result Value Ref Range   Alcohol, Ethyl (B) <10 <10 mg/dL    Comment: (NOTE) Lowest detectable limit for serum alcohol is 10 mg/dL.  For medical purposes only. Performed at [facility], [address] [place], [place] [address]   Salicylate level     Status: Abnormal   Collection Time: 11/13/19  4:04 PM  Result Value Ref Range   Salicylate Lvl <7.0 (L) 7.0 - 30.0 mg/dL    Comment: Performed at [facility], [address] [place], [place] [address]  Acetaminophen level     Status: Abnormal   Collection Time: 11/13/19  4:04 PM  Result Value Ref Range   Acetaminophen (Tylenol), Serum <10 (L) 10 - 30 ug/mL    Comment: (NOTE) Therapeutic concentrations vary significantly. A range of 10-30 ug/mL  may be an effective concentration for many patients. However, some  are best treated at concentrations outside of this  range. Acetaminophen concentrations >150 ug/mL at 4 hours after ingestion  and >50 ug/mL at 12 hours after ingestion are often associated with  toxic reactions.  Performed at [facility], [address] [place], [place] [address]   cbc     Status: None   Collection Time: 11/13/19  4:04 PM  Result Value Ref Range   WBC 7.1 4.0 - 10.5 K/uL   RBC 4.66 3.87 - 5.11 MIL/uL   Hemoglobin 14.7 12.0 - 15.0 g/dL   HCT 12.8 36 - 46 %   MCV 90.3 80.0 - 100.0 fL   MCH 31.5 26.0 - 34.0 pg   MCHC 34.9 30.0 - 36.0 g/dL   RDW 78.6 76.7 - 20.9 %   Platelets 284 150 - 400 K/uL   nRBC 0.0 0.0 - 0.2 %    Comment: Performed at [facility], [address], [place], [place] [address]  Urine Drug Screen, Qualitative     Status: None   Collection Time: 11/13/19  4:04 PM  Result Value Ref Range   Tricyclic, Ur Screen NONE DETECTED NONE DETECTED   Amphetamines, Ur Screen NONE DETECTED NONE DETECTED   MDMA (Ecstasy)Ur Screen NONE DETECTED NONE DETECTED   Cocaine Metabolite,Ur [place] NONE DETECTED NONE DETECTED   Opiate, Ur Screen NONE DETECTED NONE DETECTED   Phencyclidine (PCP) Ur S NONE DETECTED NONE DETECTED   Cannabinoid 50 Ng, Ur [place] NONE DETECTED NONE DETECTED   Barbiturates, Ur Screen NONE DETECTED NONE DETECTED   Benzodiazepine, Ur Scrn NONE DETECTED NONE DETECTED   Methadone Scn, Ur NONE DETECTED NONE DETECTED    Comment: (NOTE) Tricyclics + metabolites, urine    Cutoff 1000 ng/mL Amphetamines + metabolites, urine  Cutoff 1000 ng/mL MDMA (Ecstasy), urine              Cutoff 500 ng/mL Cocaine Metabolite, urine          Cutoff 300 ng/mL Opiate + metabolites, urine        Cutoff 300 ng/mL Phencyclidine (PCP), urine         Cutoff 25 ng/mL Cannabinoid, urine                 Cutoff 50 ng/mL Barbiturates + metabolites, urine  Cutoff 200 ng/mL Benzodiazepine, urine              Cutoff 200 ng/mL Methadone, urine                   Cutoff 300 ng/mL  The urine drug screen provides  only a preliminary, unconfirmed analytical test result and should not be used for non-medical purposes. Clinical consideration and professional judgment should be applied to any positive drug screen result due to possible interfering substances. A more specific alternate chemical method must be used in order to obtain a confirmed analytical result. Gas chromatography / mass spectrometry (GC/MS) is the preferred confirm atory method. Performed at [facility], [address] [place], [place] [address]   SARS Coronavirus 2 by RT PCR (hospital order, performed in [facility] hospital lab) Nasopharyngeal Nasopharyngeal Swab     Status: None   Collection Time: 11/13/19  4:58 PM   Specimen: Nasopharyngeal Swab  Result Value Ref Range   SARS Coronavirus 2 NEGATIVE NEGATIVE    Comment: (NOTE) SARS-CoV-2 target nucleic acids are NOT DETECTED.  The SARS-CoV-2 RNA is generally detectable in upper and lower respiratory specimens during the acute phase of infection. The lowest concentration of SARS-CoV-2 viral copies this assay can detect is 250 copies / mL. A negative result does not preclude SARS-CoV-2 infection and should not be used as the sole basis for treatment or other patient management  decisions.  A negative result may occur with improper specimen collection / handling, submission of specimen other than nasopharyngeal swab, presence of viral mutation(s) within the areas targeted by this assay, and inadequate number of viral copies (<250 copies / mL). A negative result must be combined with clinical observations, patient history, and epidemiological information.  Fact Sheet for Patients:   BoilerBrush.com.cy  Fact Sheet for Healthcare Providers: https://pope.com/  This test is not yet approved or  cleared by the [place] FDA and has been authorized for detection and/or diagnosis of SARS-CoV-2 by FDA under an Emergency  Use Authorization (EUA).  This EUA will remain in effect (meaning this test can be used) for the duration of the COVID-19 declaration under Section 564(b)(1) of the Act, 21 U.S.C. section 360bbb-3(b)(1), unless the authorization is terminated or revoked sooner.  Performed at [facility], [address] [place], [place] [address]     Blood Alcohol level:  Lab Results  Component Value Date   [facility] <10 11/13/2019    Metabolic Disorder Labs: No results found for: HGBA1C, MPG No results found for: PROLACTIN No results found for: CHOL, TRIG, HDL, CHOLHDL, VLDL, LDLCALC  Physical Findings: AIMS:  , ,  ,  ,    CIWA:    COWS:     Musculoskeletal: Strength & Muscle Tone: within normal limits Gait & Station: normal Patient leans: N/A  Psychiatric Specialty Exam: Physical Exam Vitals and nursing note reviewed.  Constitutional:      Appearance: She is well-developed.  HENT:     Head: Normocephalic and atraumatic.  Eyes:     Conjunctiva/sclera: Conjunctivae normal.     Pupils: Pupils are equal, round, and reactive to light.  Cardiovascular:     Heart sounds: Normal heart sounds.  Pulmonary:     Effort: Pulmonary effort is normal.  Abdominal:     Palpations: Abdomen is soft.  Musculoskeletal:        General: Normal range of motion.     Cervical back: Normal range of motion.  Skin:    General: Skin is warm and dry.  Neurological:     General: No focal deficit present.     Mental Status: She is alert.  Psychiatric:        Attention and Perception: She is inattentive.        Mood and Affect: Mood is anxious. Affect is labile.        Speech: Speech is tangential.        Behavior: Behavior is agitated. Behavior is not aggressive.        Thought Content: Thought content is not paranoid. Thought content includes suicidal ideation. Thought content does not include homicidal ideation. Thought content does not include suicidal plan.        Cognition and Memory:  Cognition is impaired. Memory is impaired.        Judgment: Judgment is impulsive.     Review of Systems  Constitutional: Negative.   HENT: Negative.   Eyes: Negative.   Respiratory: Negative.   Cardiovascular: Negative.   Gastrointestinal: Negative.   Musculoskeletal: Negative.   Skin: Negative.   Neurological: Negative.   Psychiatric/Behavioral: Positive for suicidal ideas. The patient is nervous/anxious.     Blood pressure (!) 112/59, pulse 81, temperature 98.5 F (36.9 C), temperature source Oral, resp. rate 17, height 5\' 7"  (1.702 m), weight 68.5 kg, SpO2 98 %.Body mass index is 23.65 kg/m.  General Appearance: Disheveled  Eye Contact:  Fair  Speech:  Garbled  Volume:  Decreased  Mood:  Anxious  Affect:  Congruent and Labile  Thought Process:  Disorganized  Orientation:  Full (Time, Place, and Person)  Thought Content:  Illogical, Rumination and Tangential  Suicidal Thoughts:  Yes.  without intent/plan  Homicidal Thoughts:  No  Memory:  Immediate;   Fair Recent;   Poor Remote;   Poor  Judgement:  Impaired  Insight:  Shallow  Psychomotor Activity:  Restlessness  Concentration:  Concentration: Poor  Recall:  Poor  Fund of Knowledge:  Fair  Language:  Fair  Akathisia:  No  Handed:  Right  AIMS (if indicated):     Assets:  Desire for Improvement Social Support  ADL's:  Impaired  Cognition:  Impaired,  Mild  Sleep:  Number of Hours: 7.25     Treatment Plan Summary: Daily contact with patient to assess and evaluate symptoms and progress in treatment, Medication management and Plan Patient with depression and anxiety and most remarkably with worsening memory symptoms of unclear etiology.  Remains confused but not obviously psychotic.  I am going to start her on introductory dose Aricept and also on Celexa for anxiety and depression.  Reviewed situation with treatment team.  Ideally we will see if we can have her go to the [place] as soon as possible.  Mordecai Rasmussen,  MD 11/15/2019, 3:17 PM

## 2019-11-15 NOTE — Plan of Care (Signed)
Patient came to nurses station multiple times states " I thought I am in [place] now I know I am in [place]." Patient looks less anxious at this time but she is very forgetful and confused.Patient is calm and pleasant on approach and  thankful for the care she is getting here.Compliant with medications.Attended groups.Appetite and energy level good.Support and encouragement given.
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
## 2019-11-15 NOTE — Progress Notes (Signed)
Recreation Therapy Notes  Date: 11/15/2019  Time: 2:00pm  Location: Courtyard   Behavioral response: Appropriate  Group Type: Leisure  Participation level: Active  Communication: Patient was social with peers and staff.  Comments: N/A  Cressida Milford LRT/CTRS        Sahvanna Mcmanigal 11/15/2019 3:39 PM

## 2019-11-15 NOTE — Plan of Care (Signed)
Patient anxious about her memory loss and confusion   Problem: Education: Goal: Emotional status will improve Outcome: Not Progressing Goal: Mental status will improve Outcome: Not Progressing

## 2019-11-15 NOTE — Progress Notes (Addendum)
Recreation Therapy Notes  Date: 11/15/2019  Time: 9:30 am  Location: Craft room   Behavioral response: Appropriate  Intervention Topic: Self-esteem   Discussion/Intervention:  Group content today was focused on self-esteem. Patient defined self-esteem and where it comes form. The group described reasons self-esteem is important. Individuals stated things that impact self-esteem and positive ways to improve self-esteem. The group participated in the intervention "self-butterfly" where patients were able to create a butterfly of positive things that makes them who they are. Clinical Observations/Feedback:  Patient came to group and defined self- esteem as how you are feeling weather it is sad or happy. She identified health, job and education as things that can affect one's self-esteem. Individual was social with peers and staff while participating in the intervention. Nadina Fomby LRT/CTRS         Cassey Hurrell 11/15/2019 1:14 PM

## 2019-11-15 NOTE — Progress Notes (Signed)
Patient pleasant and calm on the unit. Patient denies SI/HI/AVH and pain. Patient endorses anxiety and depression. Patient didn't have any scheduled medications and didn't request anything PRN. Patient observed interacting appropriately with staff and peers on the unit. Patient. Patient given education, support and encouragement to be active in her treatment plan. Patient being monitored Q 15 minutes for safety per unit protocol. Patient remains safe on the unit.  

## 2019-11-15 NOTE — Progress Notes (Signed)
Recreation Therapy Notes  INPATIENT RECREATION THERAPY ASSESSMENT  Patient Details Name: Terri Chandler MRN: [id number] DOB: 1955/09/02 Today's Date: 11/15/2019       Information Obtained From: Patient  Able to Participate in Assessment/Interview: Yes  Patient Presentation: Responsive  Reason for Admission (Per Patient): Active Symptoms, Suicidal Ideation  Patient Stressors:    Coping Skills:   Talk, Prayer, Other (Comment) (Pet animals)  Leisure Interests (2+):  Exercise - Walking, Social - Family  Frequency of Recreation/Participation: Monthly  Awareness of Community Resources:  Yes  Community Resources:  Park, Warehouse manager  Current Use:    If no, Barriers?:    Expressed Interest in [employer] Information:    [place] of Residence:  Film/video editor  Patient Main Form of Transportation: Set designer  Patient Strengths:  Good mother, hard working  Patient Identified Areas of Improvement:  Talk to the chaplain  Patient Goal for Hospitalization:  Be put on the right medication  Current SI (including self-harm):  No  Current HI:  No  Current AVH: No  Staff Intervention Plan: Group Attendance, Collaborate with Interdisciplinary Treatment Team  Consent to Intern Participation: N/A  Orie Cuttino 11/15/2019, 3:23 PM

## 2019-11-16 ENCOUNTER — Inpatient Hospital Stay: Payer: No Typology Code available for payment source

## 2019-11-16 LAB — TSH: TSH: 1.287 u[IU]/mL (ref 0.350–4.500)

## 2019-11-16 LAB — FOLATE: Folate: 10 ng/mL (ref >5.9)

## 2019-11-16 MED ORDER — BUSPIRONE HCL 5 MG PO TABS
5.0000 mg | ORAL_TABLET | Freq: Three times a day (TID) | ORAL | Status: DC
  Administered 2019-11-16 – 2019-11-17 (×3): 5 mg via ORAL
  Filled 2019-11-16 (×3): qty 1

## 2019-11-16 MED ORDER — HYDROXYZINE HCL 25 MG PO TABS
25.0000 mg | ORAL_TABLET | Freq: Four times a day (QID) | ORAL | Status: DC | PRN
  Administered 2019-11-16 – 2019-11-18 (×6): 25 mg via ORAL
  Filled 2019-11-16 (×6): qty 1

## 2019-11-16 NOTE — Progress Notes (Signed)
[facility] MD Progress Note  11/16/2019 11:09 AM Terri Chandler  MRN:  [id number] Subjective: Patient is a 64 year old female admitted on 11/14/2019 secondary to depression, suicidal ideation, memory impairment and confusion.  Objective: Patient is seen and examined.  Patient is a 64 year old female with the above-stated past psychiatric history who is seen in follow-up.  On examination today the patient is alert and oriented to 2.  That is self and location.  She did have [place] written on the notebook next to her.  She admits that her memory is quite bad.  She did remember that she was a veteran, and she thought that she was service-connected.  She stated that she had come to this area with her sister.  On admission she admitted that her depression was 10 out of 10.  She stated her depression was worse at night.  On examination today she remains mildly anxious, and clearly cognitively impaired.  She denied any suicidal ideation, but remains anxious, tremulous at times.  Review of her admission laboratories showed a negative drug screen, less than 10 alcohol, less than 10 acetaminophen, less than 7 salicylate.  CBC was completely normal.  Her electrolytes were all essentially normal.  Liver function enzymes were normal.  No imaging is currently in the chart.  Her progress note from yesterday revealed that she had had several hospitalizations through the [place] system, and was set up with an outpatient provider in [place] after discharge.  The patient is unsure what medications she had been on previously, and unsure on anything that it helped.  Her vital signs are stable, she is afebrile.  She slept 7.25 hours last night.  Her current medications include Celexa, Aricept, hydroxyzine.  Principal Problem: Episode of moderate major depression (HCC) Diagnosis: Principal Problem:   Episode of moderate major depression (HCC) Active Problems:   Dementia (HCC)  Total Time spent with patient: 30 minutes  Past  Psychiatric History: See admission H&P  Past Medical History: History reviewed. No pertinent past medical history. History reviewed. No pertinent surgical history. Family History: History reviewed. No pertinent family history. Family Psychiatric  History: See admission H&P Social History:  Social History   Substance and Sexual Activity  Alcohol Use None     Social History   Substance and Sexual Activity  Drug Use Not on file    Social History   Socioeconomic History  . Marital status: Divorced    Spouse name: Not on file  . Number of children: Not on file  . Years of education: Not on file  . Highest education level: Not on file  Occupational History  . Not on file  Tobacco Use  . Smoking status: Current Every Day Smoker  . Smokeless tobacco: Never Used  Substance and Sexual Activity  . Alcohol use: Not on file  . Drug use: Not on file  . Sexual activity: Not on file  Other Topics Concern  . Not on file  Social History Narrative  . Not on file   Social Determinants of Health   Financial Resource Strain:   . Difficulty of Paying Living Expenses:   Food Insecurity:   . Worried About Programme researcher, broadcasting/film/video in the Last Year:   . Barista in the Last Year:   Transportation Needs:   . Freight forwarder (Medical):   Marland Kitchen Lack of Transportation (Non-Medical):   Physical Activity:   . Days of Exercise per Week:   . Minutes of Exercise per Session:  Stress:   . Feeling of Stress :   Social Connections:   . Frequency of Communication with Friends and Family:   . Frequency of Social Gatherings with Friends and Family:   . Attends Religious Services:   . Active Member of Clubs or Organizations:   . Attends Banker Meetings:   Marland Kitchen Marital Status:    Additional Social History:                         Sleep: Good  Appetite:  Fair  Current Medications: Current Facility-Administered Medications  Medication Dose Route Frequency Provider  Last Rate Last Admin  . acetaminophen (TYLENOL) tablet 650 mg  650 mg Oral Q6H PRN Clapacs, John T, MD      . alum & mag hydroxide-simeth (MAALOX/MYLANTA) 200-200-20 MG/5ML suspension 30 mL  30 mL Oral Q4H PRN Clapacs, John T, MD      . citalopram (CELEXA) tablet 20 mg  20 mg Oral Daily Clapacs, Jackquline Denmark, MD   20 mg at 11/16/19 8003  . donepezil (ARICEPT) tablet 5 mg  5 mg Oral QHS Clapacs, John T, MD   5 mg at 11/15/19 2141  . hydrOXYzine (ATARAX/VISTARIL) tablet 50 mg  50 mg Oral Q6H PRN Clapacs, Jackquline Denmark, MD   50 mg at 11/15/19 2142  . magnesium hydroxide (MILK OF MAGNESIA) suspension 30 mL  30 mL Oral Daily PRN Clapacs, John T, MD      . nicotine (NICODERM CQ - dosed in mg/24 hours) patch 21 mg  21 mg Transdermal Daily Clapacs, Jackquline Denmark, MD   21 mg at 11/16/19 4917    Lab Results: No results found for this or any previous visit (from the past 48 hour(s)).  Blood Alcohol level:  Lab Results  Component Value Date   ETH <10 11/13/2019    Metabolic Disorder Labs: No results found for: HGBA1C, MPG No results found for: PROLACTIN No results found for: CHOL, TRIG, HDL, CHOLHDL, VLDL, LDLCALC  Physical Findings: AIMS:  , ,  ,  ,    CIWA:    COWS:     Musculoskeletal: Strength & Muscle Tone: within normal limits Gait & Station: normal Patient leans: N/A  Psychiatric Specialty Exam: Physical Exam Vitals and nursing note reviewed.  Constitutional:      Appearance: Normal appearance.  HENT:     Head: Normocephalic and atraumatic.  Pulmonary:     Effort: Pulmonary effort is normal.  Neurological:     General: No focal deficit present.     Mental Status: She is alert.     Review of Systems  Blood pressure 108/71, pulse 75, temperature 98.5 F (36.9 C), temperature source Oral, resp. rate 18, height 5\' 7"  (1.702 m), weight 68.5 kg, SpO2 98 %.Body mass index is 23.65 kg/m.  General Appearance: Casual  Eye Contact:  Fair  Speech:  Normal Rate  Volume:  Increased  Mood:  Anxious   Affect:  Congruent  Thought Process:  Goal Directed and Descriptions of Associations: Circumstantial  Orientation:  Other:  Oriented to person and place  Thought Content:  Rumination  Suicidal Thoughts:  No  Homicidal Thoughts:  No  Memory:  Immediate;   Poor Recent;   Poor Remote;   Poor  Judgement:  Intact  Insight:  Fair  Psychomotor Activity:  Increased  Concentration:  Concentration: Fair and Attention Span: Fair  Recall:  Poor  Fund of Knowledge:  Poor  Language:  Good  Akathisia:  Negative  Handed:  Right  AIMS (if indicated):     Assets:  Desire for Improvement Resilience  ADL's:  Intact  Cognition:  Impaired,  Moderate  Sleep:  Number of Hours: 7.25     Treatment Plan Summary: Daily contact with patient to assess and evaluate symptoms and progress in treatment, Medication management and Plan : Patient is seen and examined.  Patient is a 64 year old female with the above-stated past psychiatric history who is seen in follow-up.   Diagnosis: 1.  Major depression, recurrent, severe. 2.  Generalized anxiety disorder. 3.  Dementia NOS  Pertinent findings on examination today: 1.  Patient complains of significant anxiety. 2.  Patient is oriented to person and place only. 3.  Significant cognitive deficits.  Plan: 1.  Continue Celexa 20 mg p.o. daily for depression and anxiety. 2.  Continue donepezil 5 mg p.o. nightly for dementia. 3.  Continue hydroxyzine 50 mg p.o. every 6 hours as needed anxiety. 4.  Add BuSpar 5 mg p.o. 3 times daily for anxiety. 5.  Order reversible dementia work-up including TSH, B12 and folate, RPR. 6.  Noncontrasted CT scan of the head to rule out other sources of cognitive deficits. 7.  Disposition planning-in progress.  Terri Pert, MD 11/16/2019, 11:09 AM

## 2019-11-16 NOTE — Progress Notes (Signed)
Pt is very disoriented. She ask the same questions over and over. Pt is very pleasant and cooperative. Torrie Mayers RN

## 2019-11-16 NOTE — Plan of Care (Signed)
Pt rates depression, anxiety and hopelessness all at 10/10. Pt denies SI HI and AVH. Pt was educated on care plan and verbalizes understanding. Torrie Mayers RN Problem: Education: Goal: Knowledge of [place] [facility] Education information/materials will improve Outcome: Progressing Goal: Emotional status will improve Outcome: Progressing Goal: Mental status will improve Outcome: Progressing Goal: Verbalization of understanding the information provided will improve Outcome: Progressing   Problem: Health Behavior/Discharge Planning: Goal: Identification of resources available to assist in meeting health care needs will improve Outcome: Progressing Goal: Compliance with treatment plan for underlying cause of condition will improve Outcome: Progressing   Problem: Physical Regulation: Goal: Ability to maintain clinical measurements within normal limits will improve Outcome: Progressing   Problem: Education: Goal: Utilization of techniques to improve thought processes will improve Outcome: Progressing Goal: Knowledge of the prescribed therapeutic regimen will improve Outcome: Progressing   Problem: Activity: Goal: Interest or engagement in leisure activities will improve Outcome: Progressing Goal: Imbalance in normal sleep/wake cycle will improve Outcome: Progressing   Problem: Coping: Goal: Coping ability will improve Outcome: Progressing Goal: Will verbalize feelings Outcome: Progressing   Problem: Role Relationship: Goal: Will demonstrate positive changes in social behaviors and relationships Outcome: Progressing   Problem: Safety: Goal: Ability to disclose and discuss suicidal ideas will improve Outcome: Progressing Goal: Ability to identify and utilize support systems that promote safety will improve Outcome: Progressing   Problem: Self-Concept: Goal: Will verbalize positive feelings about self Outcome: Progressing Goal: Level of anxiety will decrease Outcome:  Progressing

## 2019-11-16 NOTE — Progress Notes (Addendum)
Pt was moved to a room that is closer to the main hall and nurse's station. Pt gets very disoriented. Torrie Mayers RN

## 2019-11-16 NOTE — [facility] Group Notes (Signed)
LCSW Group Therapy Note  11/16/2019   1:34 PM- 2:15 PM   Type of Therapy and Topic:  Group Therapy: Anger Cues and Responses  Participation Level:  Active   Description of Group:   In this group, patients learned how to recognize the physical, cognitive, emotional, and behavioral responses they have to anger-provoking situations.  They identified a recent time they became angry and how they reacted.  They analyzed how their reaction was possibly beneficial and how it was possibly unhelpful.  The group discussed a variety of healthier coping skills that could help with such a situation in the future.  Focus was placed on how helpful it is to recognize the underlying emotions to our anger, because working on those can lead to a more permanent solution as well as our ability to focus on the important rather than the urgent.  Therapeutic Goals: 1. Patients will remember their last incident of anger and how they felt emotionally and physically, what their thoughts were at the time, and how they behaved. 2. Patients will identify how their behavior at that time worked for them, as well as how it worked against them. 3. Patients will explore possible new behaviors to use in future anger situations. 4. Patients will learn that anger itself is normal and cannot be eliminated, and that healthier reactions can assist with resolving conflict rather than worsening situations.  Summary of Patient Progress:  Patient shared that she does not get angry and that she will cry. Patient spoke about being depressed and going to her room when she feels this way. Patient was able to talk about going for a walk, listening to music as her coping skills. Patient appeared to be struggling with memory and CSW had to repeat questions numerous times during group. Patient spoke about feeling not so good today because she needs to find a house and that she did not want to leave the hospital and pay for a motel. Patient considered  making an anger log when she is upset and crying.   Therapeutic Modalities:   Cognitive Behavioral Therapy    Susa Simmonds, LCSWA 11/16/2019  2:24 PM

## 2019-11-17 ENCOUNTER — Inpatient Hospital Stay: Payer: No Typology Code available for payment source

## 2019-11-17 LAB — RPR: RPR Ser Ql: NONREACTIVE

## 2019-11-17 MED ORDER — MEMANTINE HCL 5 MG PO TABS
5.0000 mg | ORAL_TABLET | Freq: Every day | ORAL | Status: DC
  Administered 2019-11-17 – 2019-11-19 (×3): 5 mg via ORAL
  Filled 2019-11-17 (×4): qty 1

## 2019-11-17 MED ORDER — BUSPIRONE HCL 5 MG PO TABS
10.0000 mg | ORAL_TABLET | Freq: Three times a day (TID) | ORAL | Status: DC
  Administered 2019-11-17 – 2019-11-19 (×6): 10 mg via ORAL
  Filled 2019-11-17 (×6): qty 2

## 2019-11-17 NOTE — Plan of Care (Signed)
Pt rates anxiety 10/10. Pt denies SI, HI and AVH. Pt was educated on care plan and verbalizes understanding. Torrie Mayers RN Problem: Education: Goal: Knowledge of [place] [facility] Education information/materials will improve Outcome: Progressing Goal: Emotional status will improve Outcome: Progressing Goal: Mental status will improve Outcome: Progressing Goal: Verbalization of understanding the information provided will improve Outcome: Progressing   Problem: Health Behavior/Discharge Planning: Goal: Identification of resources available to assist in meeting health care needs will improve Outcome: Progressing Goal: Compliance with treatment plan for underlying cause of condition will improve Outcome: Progressing   Problem: Physical Regulation: Goal: Ability to maintain clinical measurements within normal limits will improve Outcome: Progressing   Problem: Education: Goal: Utilization of techniques to improve thought processes will improve Outcome: Progressing Goal: Knowledge of the prescribed therapeutic regimen will improve Outcome: Progressing   Problem: Activity: Goal: Interest or engagement in leisure activities will improve Outcome: Progressing Goal: Imbalance in normal sleep/wake cycle will improve Outcome: Progressing   Problem: Coping: Goal: Coping ability will improve Outcome: Progressing Goal: Will verbalize feelings Outcome: Progressing   Problem: Role Relationship: Goal: Will demonstrate positive changes in social behaviors and relationships Outcome: Progressing   Problem: Safety: Goal: Ability to disclose and discuss suicidal ideas will improve Outcome: Progressing Goal: Ability to identify and utilize support systems that promote safety will improve Outcome: Progressing   Problem: Self-Concept: Goal: Will verbalize positive feelings about self Outcome: Progressing Goal: Level of anxiety will decrease Outcome: Progressing

## 2019-11-17 NOTE — [facility] Group Notes (Signed)
[facility] LCSW Group Therapy Note  Date/Time:  11/17/2019 1:20 PM- 2:05 PM  Type of Therapy and Topic:  Group Therapy:  Healthy and Unhealthy Supports  Participation Level:  Active   Description of Group:  Patients in this group were introduced to the idea of adding a variety of healthy supports to address the various needs in their lives.Patients discussed what additional healthy supports could be helpful in their recovery and wellness after discharge in order to prevent future hospitalizations.   An emphasis was placed on using counselor, doctor, therapy groups, 12-step groups, and problem-specific support groups to expand supports.  They also worked as a group on developing a specific plan for several patients to deal with unhealthy supports through boundary-setting, psychoeducation with loved ones, and even termination of relationships.   Therapeutic Goals:   1)  discuss importance of adding supports to stay well once out of the hospital  2)  compare healthy versus unhealthy supports and identify some examples of each  3)  generate ideas and descriptions of healthy supports that can be added  4)  offer mutual support about how to address unhealthy supports  5)  encourage active participation in and adherence to discharge plan    Summary of Patient Progress: Patient checked into group feeling good today. The patient stated that her current healthy support in her life are her sons. Patient did not disclose any unhealthy supports. Patient appeared to be confused in group at times and did not understand the questions that were being asked.   Therapeutic Modalities:   Motivational Interviewing Brief Solution-Focused Therapy  Sharman Cheek 11/17/2019  3:39 PM

## 2019-11-17 NOTE — Progress Notes (Signed)
Pt states that she feels so much better. She said that she has not felt this good in a long time. Torrie Mayers RN

## 2019-11-17 NOTE — Tx Team (Signed)
Interdisciplinary Treatment and Diagnostic Plan Update  11/17/2019 Time of Session: 9:15 AM Terri Chandler MRN: [id number]  Principal Diagnosis: Episode of moderate major depression ([place])  Secondary Diagnoses: Principal Problem:   Episode of moderate major depression ([place]) Active Problems:   Dementia ([place])   Current Medications:  Current Facility-Administered Medications  Medication Dose Route Frequency Provider Last Rate Last Admin  . acetaminophen (TYLENOL) tablet 650 mg  650 mg Oral Q6H PRN Clapacs, John T, MD      . alum & mag hydroxide-simeth (MAALOX/MYLANTA) 200-200-20 MG/5ML suspension 30 mL  30 mL Oral Q4H PRN Clapacs, John T, MD      . busPIRone (BUSPAR) tablet 5 mg  5 mg Oral TID Sharma Covert, MD   5 mg at 11/17/19 0816  . citalopram (CELEXA) tablet 20 mg  20 mg Oral Daily Clapacs, Madie Reno, MD   20 mg at 11/17/19 0816  . donepezil (ARICEPT) tablet 5 mg  5 mg Oral QHS Clapacs, John T, MD   5 mg at 11/16/19 2118  . hydrOXYzine (ATARAX/VISTARIL) tablet 25 mg  25 mg Oral Q6H PRN Sharma Covert, MD   25 mg at 11/16/19 2119  . magnesium hydroxide (MILK OF MAGNESIA) suspension 30 mL  30 mL Oral Daily PRN Clapacs, John T, MD      . nicotine (NICODERM CQ - dosed in mg/24 hours) patch 21 mg  21 mg Transdermal Daily Clapacs, Madie Reno, MD   21 mg at 11/17/19 0630   PTA Medications: No medications prior to admission.    Patient Stressors: Loss of brother and sister Medication change or noncompliance Traumatic event  Patient Strengths: Average or above average intelligence Capable of independent living Communication skills Motivation for treatment/growth Supportive family/friends  Treatment Modalities: Medication Management, Group therapy, Case management,  1 to 1 session with clinician, Psychoeducation, Recreational therapy.   Physician Treatment Plan for Primary Diagnosis: Episode of moderate major depression ([place]) Long Term Goal(s): Improvement in symptoms so as ready  for discharge Improvement in symptoms so as ready for discharge   Short Term Goals: Ability to demonstrate self-control will improve Ability to maintain clinical measurements within normal limits will improve Ability to maintain clinical measurements within normal limits will improve Compliance with prescribed medications will improve  Medication Management: Evaluate patient's response, side effects, and tolerance of medication regimen.  Therapeutic Interventions: 1 to 1 sessions, Unit Group sessions and Medication administration.  Evaluation of Outcomes: Not Met  Physician Treatment Plan for Secondary Diagnosis: Principal Problem:   Episode of moderate major depression ([place]) Active Problems:   Dementia ([place])  Long Term Goal(s): Improvement in symptoms so as ready for discharge Improvement in symptoms so as ready for discharge   Short Term Goals: Ability to demonstrate self-control will improve Ability to maintain clinical measurements within normal limits will improve Ability to maintain clinical measurements within normal limits will improve Compliance with prescribed medications will improve     Medication Management: Evaluate patient's response, side effects, and tolerance of medication regimen.  Therapeutic Interventions: 1 to 1 sessions, Unit Group sessions and Medication administration.  Evaluation of Outcomes: Not Met   RN Treatment Plan for Primary Diagnosis: Episode of moderate major depression ([place]) Long Term Goal(s): Knowledge of disease and therapeutic regimen to maintain health will improve  Short Term Goals: Ability to participate in decision making will improve, Ability to verbalize feelings will improve, Ability to disclose and discuss suicidal ideas, Ability to identify and develop effective coping behaviors will improve  and Compliance with prescribed medications will improve  Medication Management: RN will administer medications as ordered by provider, will  assess and evaluate patient's response and provide education to patient for prescribed medication. RN will report any adverse and/or side effects to prescribing provider.  Therapeutic Interventions: 1 on 1 counseling sessions, Psychoeducation, Medication administration, Evaluate responses to treatment, Monitor vital signs and CBGs as ordered, Perform/monitor CIWA, COWS, AIMS and Fall Risk screenings as ordered, Perform wound care treatments as ordered.  Evaluation of Outcomes: Not Met   LCSW Treatment Plan for Primary Diagnosis: Episode of moderate major depression ([place]) Long Term Goal(s): Safe transition to appropriate next level of care at discharge, Engage patient in therapeutic group addressing interpersonal concerns.  Short Term Goals: Engage patient in aftercare planning with referrals and resources, Increase social support, Increase ability to appropriately verbalize feelings, Increase emotional regulation, Facilitate acceptance of mental health diagnosis and concerns, Identify triggers associated with mental health/substance abuse issues and Increase skills for wellness and recovery  Therapeutic Interventions: Assess for all discharge needs, 1 to 1 time with Social worker, Explore available resources and support systems, Assess for adequacy in community support network, Educate family and significant other(s) on suicide prevention, Complete Psychosocial Assessment, Interpersonal group therapy.  Evaluation of Outcomes: Not Met   Progress in Treatment: Attending groups: Yes. Participating in groups: Yes. Taking medication as prescribed: Yes. Toleration medication: Yes. Family/Significant other contact made: Yes, individual(s) contacted:  Patients son was contacted  Patient understands diagnosis: No. Discussing patient identified problems/goals with staff: Yes. Medical problems stabilized or resolved: No. Denies suicidal/homicidal ideation: Yes. Issues/concerns per patient  self-inventory: No. Other: N/a  New problem(s) identified: No, Describe:  None   New Short Term/Long Term Goal(s): Elimination of symptoms of psychosis, medication management for mood stabilization; development of comprehensive mental wellness plan  Patient Goals: Patient stated that she would like to get a home in the surrounding area with her son. Patient also stated that she would like to feel better.    Discharge Plan or Barriers: Patient reports her plan to return home to her son. [facility] services are pending at this time to assist patient who is exhibiting signs of dementia.   Reason for Continuation of Hospitalization: Depression Suicidal ideation  Estimated Length of Stay: TBD  Attendees: Patient: Terri Chandler  11/17/2019 9:30 AM  Physician: Myles Lipps, MD 11/17/2019 9:30 AM  Nursing: Collier Bullock, RN 11/17/2019 9:30 AM  RN Care Manager: 11/17/2019 9:30 AM  Social Worker: Raina Mina, [place] 11/17/2019 9:30 AM  Recreational Therapist:  11/17/2019 9:30 AM  Other:  11/17/2019 9:30 AM  Other:  11/17/2019 9:30 AM  Other: 11/17/2019 9:30 AM    Scribe for Treatment Team: Raina Mina, [place] 11/17/2019 9:30 AM

## 2019-11-17 NOTE — Progress Notes (Signed)
Pt says "it's a miracle" that she feels so much better. She says "that calming pill" helped. She has been pleasant but confused and disoriented. Pt went to group and asked if there would be more. Torrie Mayers RN

## 2019-11-17 NOTE — Progress Notes (Signed)
[facility] MD Progress Note  11/17/2019 10:21 AM Terri Chandler  MRN:  [id number] Subjective:  Patient is a 64 year old female admitted on 11/14/2019 secondary to depression, suicidal ideation, memory impairment and confusion.  Objective: Patient is seen and examined.  Patient is a 64 year old female with the above-stated past psychiatric history who is seen in follow-up.  She is essentially unchanged from yesterday.  She is alert and oriented to 1 today.  She was unsure of where she was at.  Because of her memory issues we went on and got a CT scan of her head done yesterday.  The CT of her brain revealed diffuse cerebral atrophy.  It was slightly advanced for age.  There was no acute intracranial abnormality.  No evidence of any hemorrhages.  We also did the reversible dementia work-up yesterday.  Her TSH was normal at 1.287.  Folic acid was normal.  Her cyanocobalamin is still pending.  Her RPR is pending as well.  She had an ultrasound done of her carotids bilaterally, and those results are pending.  She denied any auditory or visual hallucinations.  She denied any suicidal or homicidal ideation.  Her vital signs are stable, she is afebrile.  She continues to be mildly tremulous.  She slept 7.25 hours last night.  Principal Problem: Episode of moderate major depression (HCC) Diagnosis: Principal Problem:   Episode of moderate major depression (HCC) Active Problems:   Dementia (HCC)  Total Time spent with patient: 20 minutes  Past Psychiatric History: See admission H&P  Past Medical History: History reviewed. No pertinent past medical history. History reviewed. No pertinent surgical history. Family History: History reviewed. No pertinent family history. Family Psychiatric  History: See admission H&P Social History:  Social History   Substance and Sexual Activity  Alcohol Use None     Social History   Substance and Sexual Activity  Drug Use Not on file    Social History   Socioeconomic  History  . Marital status: Divorced    Spouse name: Not on file  . Number of children: Not on file  . Years of education: Not on file  . Highest education level: Not on file  Occupational History  . Not on file  Tobacco Use  . Smoking status: Current Every Day Smoker  . Smokeless tobacco: Never Used  Substance and Sexual Activity  . Alcohol use: Not on file  . Drug use: Not on file  . Sexual activity: Not on file  Other Topics Concern  . Not on file  Social History Narrative  . Not on file   Social Determinants of Health   Financial Resource Strain:   . Difficulty of Paying Living Expenses:   Food Insecurity:   . Worried About Programme researcher, broadcasting/film/video in the Last Year:   . Barista in the Last Year:   Transportation Needs:   . Freight forwarder (Medical):   Marland Kitchen Lack of Transportation (Non-Medical):   Physical Activity:   . Days of Exercise per Week:   . Minutes of Exercise per Session:   Stress:   . Feeling of Stress :   Social Connections:   . Frequency of Communication with Friends and Family:   . Frequency of Social Gatherings with Friends and Family:   . Attends Religious Services:   . Active Member of Clubs or Organizations:   . Attends Banker Meetings:   Marland Kitchen Marital Status:    Additional Social History:  Sleep: Good  Appetite:  Fair  Current Medications: Current Facility-Administered Medications  Medication Dose Route Frequency Provider Last Rate Last Admin  . acetaminophen (TYLENOL) tablet 650 mg  650 mg Oral Q6H PRN Clapacs, John T, MD      . alum & mag hydroxide-simeth (MAALOX/MYLANTA) 200-200-20 MG/5ML suspension 30 mL  30 mL Oral Q4H PRN Clapacs, John T, MD      . busPIRone (BUSPAR) tablet 5 mg  5 mg Oral TID Antonieta Pert, MD   5 mg at 11/17/19 0816  . citalopram (CELEXA) tablet 20 mg  20 mg Oral Daily Clapacs, Jackquline Denmark, MD   20 mg at 11/17/19 0816  . donepezil (ARICEPT) tablet 5 mg  5 mg Oral  QHS Clapacs, John T, MD   5 mg at 11/16/19 2118  . hydrOXYzine (ATARAX/VISTARIL) tablet 25 mg  25 mg Oral Q6H PRN Antonieta Pert, MD   25 mg at 11/16/19 2119  . magnesium hydroxide (MILK OF MAGNESIA) suspension 30 mL  30 mL Oral Daily PRN Clapacs, John T, MD      . nicotine (NICODERM CQ - dosed in mg/24 hours) patch 21 mg  21 mg Transdermal Daily Clapacs, Jackquline Denmark, MD   21 mg at 11/17/19 4627    Lab Results:  Results for orders placed or performed during the hospital encounter of 11/14/19 (from the past 48 hour(s))  TSH     Status: None   Collection Time: 11/16/19 12:25 PM  Result Value Ref Range   TSH 1.287 0.350 - 4.500 uIU/mL    Comment: Performed by a 3rd Generation assay with a functional sensitivity of <=0.01 uIU/mL. Performed at [facility], [address] [place], [place] [address]   Folate, serum, performed at [facility] lab     Status: None   Collection Time: 11/16/19 12:25 PM  Result Value Ref Range   Folate 10.0 >5.9 ng/mL    Comment: Performed at [facility], [address] [place], [place] [address]    Blood Alcohol level:  Lab Results  Component Value Date   [facility] <10 11/13/2019    Metabolic Disorder Labs: No results found for: HGBA1C, MPG No results found for: PROLACTIN No results found for: CHOL, TRIG, HDL, CHOLHDL, VLDL, LDLCALC  Physical Findings: AIMS:  , ,  ,  ,    CIWA:    COWS:     Musculoskeletal: Strength & Muscle Tone: within normal limits Gait & Station: normal Patient leans: N/A  Psychiatric Specialty Exam: Physical Exam Vitals and nursing note reviewed.  Constitutional:      Appearance: Normal appearance.  HENT:     Head: Normocephalic and atraumatic.  Pulmonary:     Effort: Pulmonary effort is normal.  Neurological:     General: No focal deficit present.     Mental Status: She is alert.     Review of Systems  Blood pressure 122/63, pulse 73, temperature 97.9 F (36.6 C), temperature source Oral, resp.  rate 18, height 5\' 7"  (1.702 m), weight 68.5 kg, SpO2 99 %.Body mass index is 23.65 kg/m.  General Appearance: Casual  Eye Contact:  Fair  Speech:  Normal Rate  Volume:  Normal  Mood:  Anxious  Affect:  Congruent  Thought Process:  Goal Directed and Descriptions of Associations: Circumstantial  Orientation:  Negative  Thought Content:  Rumination  Suicidal Thoughts:  No  Homicidal Thoughts:  No  Memory:  Immediate;   Poor Recent;   Poor Remote;  Poor  Judgement:  Intact  Insight:  Fair  Psychomotor Activity:  Increased  Concentration:  Concentration: Fair and Attention Span: Fair  Recall:  [employer] of Knowledge:  Poor  Language:  Fair  Akathisia:  Negative  Handed:  Right  AIMS (if indicated):     Assets:  Desire for Improvement Housing Resilience Social Support  ADL's:  Intact  Cognition:  Impaired,  Moderate  Sleep:  Number of Hours: 7.25     Treatment Plan Summary: Daily contact with patient to assess and evaluate symptoms and progress in treatment, Medication management and Plan : Patient is seen and examined.  Patient is a 64 year old female with the above-stated past psychiatric history who is seen in follow-up.   Diagnosis: 1.  Major depression, recurrent, severe. 2.  Generalized anxiety disorder. 3.  Dementia NOS  Pertinent findings on examination today: 1.  Noncontrasted CT scan of the brain showed significant cerebral atrophy. 2.  Reversible dementia work-up so far is negative, RPR and cyanocobalamin are still pending. 3.  Bilateral carotid ultrasound results is still pending. 4.  Continues to have anxiety. 5.  Continues to be disoriented. 6.  Patient stated mood was improving.  Plan: 1.  Continue Celexa 20 mg p.o. daily for depression and anxiety. 2.  Continue donepezil 5 mg p.o. nightly for dementia. 3.  Continue hydroxyzine 25 mg p.o. every 6 hours as needed anxiety. 4.  Increase BuSpar to 10 mg p.o. 3 times daily for anxiety. 5.  Start  Namenda 5 mg p.o. daily and titrate for dementia. 6.  Disposition planning-in progress.  Antonieta Pert, MD 11/17/2019, 10:21 AM

## 2019-11-18 NOTE — [facility] Suicide Risk Assessment (Signed)
[facility] INPATIENT:  Family/Significant Other Suicide Prevention Education  Suicide Prevention Education:  Contact Attempts: Terri Chandler, son [phone], has been identified by the patient as the family member/significant other with whom the patient will be residing, and identified as the person(s) who will aid the patient in the event of a mental health crisis.  With written consent from the patient, two attempts were made to provide suicide prevention education, prior to and/or following the patient's discharge.  We were unsuccessful in providing suicide prevention education.  A suicide education pamphlet was given to the patient to share with family/significant other.  Date and time of first attempt: 11/18/2019 at 10:25AM Date and time of second attempt: Second attempt is needed.  CSW left HIPAA compliant voicemail.  Terri Chandler 11/18/2019, 10:26 AM

## 2019-11-18 NOTE — Progress Notes (Signed)
Recreation Therapy Notes  Date: 11/18/2019  Time: 9:30 am  Location: Craft room   Behavioral response: Appropriate  Intervention Topic: Goals    Discussion/Intervention:  Group content on today was focused on goals. Patients described what goals are and how they define goals. Individuals expressed how they go about setting goals and reaching them. The group identified how important goals are and if they make short term goals to reach long term goals. Patients described how many goals they work on at a time and what affects them not reaching their goal. Individuals described how much time they put into planning and obtaining their goals. The group participated in the intervention "My Goal Board" and made personal goal boards to help them achieve their goal. Clinical Observations/Feedback:  Patient came to group and identified her goals as to go back home and be more even and to live life. She explained that sometimes she stops herself from reaching her goal. Participant explained that her it is important to reach goals to be happier and do things that you wan to do. Individual was social with peers and staff while participating in the intervention. Terri Chandler LRT/CTRS           Terri Chandler 11/18/2019 11:03 AM

## 2019-11-18 NOTE — Plan of Care (Signed)
  Problem: Education: Goal: Knowledge of [facility] Education information/materials will improve Outcome: Progressing Goal: Emotional status will improve Outcome: Progressing Goal: Mental status will improve Outcome: Progressing Goal: Verbalization of understanding the information provided will improve Outcome: Progressing   Problem: Health Behavior/Discharge Planning: Goal: Identification of resources available to assist in meeting health care needs will improve Outcome: Progressing Goal: Compliance with treatment plan for underlying cause of condition will improve Outcome: Progressing   
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
## 2019-11-18 NOTE — Progress Notes (Signed)
Patient has been brighter. Still complaining of some anxiety. Requested and received Vistaril for complaint of anxiety with relief. Also assisted patient with calling her son because she was anxious about him. Had to be redirected to her room once and needed reminding that her bathroom is located inside her room. Denies SI, HI and AVH.

## 2019-11-18 NOTE — Progress Notes (Signed)
[facility] MD Progress Note  11/18/2019 4:19 PM Terri Chandler  MRN:  [id number] Subjective: Patient seen chart reviewed.  Patient told me this morning she thought that her son had "abandoned her".  Patient's memory continues to clearly be impaired.  I explained to her that not only had her son not abandoned her, he actually was not the one who brought her to the hospital.  Patient did not have any memory of this.  Together we called her son on the telephone and he reassured her that he would be happy to pick her up and take her home anytime.  Patient is denying any suicidal thoughts and has been pretty much calm.  Not talking about suicide and days and says that her mood is better. Principal Problem: Episode of moderate major depression (HCC) Diagnosis: Principal Problem:   Episode of moderate major depression (HCC) Active Problems:   Dementia (HCC)  Total Time spent with patient: 30 minutes  Past Psychiatric History: Past history of depression and anxiety intertwined with a serious problem with memory impairment of unclear etiology  Past Medical History: History reviewed. No pertinent past medical history. History reviewed. No pertinent surgical history. Family History: History reviewed. No pertinent family history. Family Psychiatric  History: See previous Social History:  Social History   Substance and Sexual Activity  Alcohol Use None     Social History   Substance and Sexual Activity  Drug Use Not on file    Social History   Socioeconomic History  . Marital status: Divorced    Spouse name: Not on file  . Number of children: Not on file  . Years of education: Not on file  . Highest education level: Not on file  Occupational History  . Not on file  Tobacco Use  . Smoking status: Current Every Day Smoker  . Smokeless tobacco: Never Used  Substance and Sexual Activity  . Alcohol use: Not on file  . Drug use: Not on file  . Sexual activity: Not on file  Other Topics Concern  .  Not on file  Social History Narrative  . Not on file   Social Determinants of Health   Financial Resource Strain:   . Difficulty of Paying Living Expenses:   Food Insecurity:   . Worried About Programme researcher, broadcasting/film/video in the Last Year:   . Barista in the Last Year:   Transportation Needs:   . Freight forwarder (Medical):   Marland Kitchen Lack of Transportation (Non-Medical):   Physical Activity:   . Days of Exercise per Week:   . Minutes of Exercise per Session:   Stress:   . Feeling of Stress :   Social Connections:   . Frequency of Communication with Friends and Family:   . Frequency of Social Gatherings with Friends and Family:   . Attends Religious Services:   . Active Member of Clubs or Organizations:   . Attends Banker Meetings:   Marland Kitchen Marital Status:    Additional Social History:                         Sleep: Fair  Appetite:  Fair  Current Medications: Current Facility-Administered Medications  Medication Dose Route Frequency Provider Last Rate Last Admin  . acetaminophen (TYLENOL) tablet 650 mg  650 mg Oral Q6H PRN Caral Whan T, MD      . alum & mag hydroxide-simeth (MAALOX/MYLANTA) 200-200-20 MG/5ML suspension 30 mL  30 mL  Oral Q4H PRN Anabell Swint T, MD      . busPIRone (BUSPAR) tablet 10 mg  10 mg Oral TID Antonieta Pert, MD   10 mg at 11/18/19 1112  . citalopram (CELEXA) tablet 20 mg  20 mg Oral Daily Maui Ahart, Jackquline Denmark, MD   20 mg at 11/18/19 6644  . donepezil (ARICEPT) tablet 5 mg  5 mg Oral QHS Stepahnie Campo T, MD   5 mg at 11/17/19 2036  . hydrOXYzine (ATARAX/VISTARIL) tablet 25 mg  25 mg Oral Q6H PRN Antonieta Pert, MD   25 mg at 11/18/19 1517  . magnesium hydroxide (MILK OF MAGNESIA) suspension 30 mL  30 mL Oral Daily PRN Raiya Stainback T, MD      . [facility]) tablet 5 mg  5 mg Oral Daily Antonieta Pert, MD   5 mg at 11/18/19 [phone]  . nicotine (NICODERM CQ - dosed in mg/24 hours) patch 21 mg  21 mg Transdermal Daily  Capucine Tryon, Jackquline Denmark, MD   21 mg at 11/18/19 4259    Lab Results: No results found for this or any previous visit (from the past 48 hour(s)).  Blood Alcohol level:  Lab Results  Component Value Date   ETH <10 11/13/2019    Metabolic Disorder Labs: No results found for: HGBA1C, MPG No results found for: PROLACTIN No results found for: CHOL, TRIG, HDL, CHOLHDL, VLDL, LDLCALC  Physical Findings: AIMS:  , ,  ,  ,    CIWA:    COWS:     Musculoskeletal: Strength & Muscle Tone: within normal limits Gait & Station: normal Patient leans: N/A  Psychiatric Specialty Exam: Physical Exam Vitals and nursing note reviewed.  Constitutional:      Appearance: She is well-developed.  HENT:     Head: Normocephalic and atraumatic.  Eyes:     Conjunctiva/sclera: Conjunctivae normal.     Pupils: Pupils are equal, round, and reactive to light.  Cardiovascular:     Heart sounds: Normal heart sounds.  Pulmonary:     Effort: Pulmonary effort is normal.  Abdominal:     Palpations: Abdomen is soft.  Musculoskeletal:        General: Normal range of motion.     Cervical back: Normal range of motion.  Skin:    General: Skin is warm and dry.  Neurological:     General: No focal deficit present.     Mental Status: She is alert.  Psychiatric:        Attention and Perception: She is inattentive.        Mood and Affect: Mood is anxious.        Speech: Speech normal.        Behavior: Behavior is agitated. Behavior is not aggressive.        Thought Content: Thought content is not paranoid. Thought content does not include homicidal or suicidal ideation.        Cognition and Memory: Cognition is impaired. Memory is impaired. She exhibits impaired recent memory and impaired remote memory.        Judgment: Judgment is inappropriate.     Review of Systems  Constitutional: Negative.   HENT: Negative.   Eyes: Negative.   Respiratory: Negative.   Cardiovascular: Negative.   Gastrointestinal: Negative.    Musculoskeletal: Negative.   Skin: Negative.   Neurological: Negative.   Psychiatric/Behavioral: Positive for agitation and decreased concentration. Negative for suicidal ideas.    Blood pressure 104/60, pulse 68, temperature 98.5  F (36.9 C), temperature source Oral, resp. rate 18, height 5\' 7"  (1.702 m), weight 68.5 kg, SpO2 98 %.Body mass index is 23.65 kg/m.  General Appearance: Casual  Eye Contact:  Fair  Speech:  Clear and Coherent  Volume:  Decreased  Mood:  Anxious  Affect:  Constricted  Thought Process:  Coherent  Orientation:  Negative  Thought Content:  Rumination  Suicidal Thoughts:  No  Homicidal Thoughts:  No  Memory:  Immediate;   Fair Recent;   Fair Remote;   Fair  Judgement:  Impaired  Insight:  Shallow  Psychomotor Activity:  Normal  Concentration:  Concentration: Poor  Recall:  Poor  Fund of Knowledge:  Fair  Language:  Fair  Akathisia:  No  Handed:  Right  AIMS (if indicated):     Assets:  Desire for Improvement Housing Resilience Social Support  ADL's:  Impaired  Cognition:  Impaired,  Mild  Sleep:  Number of Hours: 7.45     Treatment Plan Summary: Daily contact with patient to assess and evaluate symptoms and progress in treatment, Medication management and Plan Patient appears to be stable and not acutely dangerous.  We confirmed that she has follow-up already in place with the BA.  We will make preparations for likely discharge tomorrow.  No change to medicine.  , MD 11/18/2019, 4:19 PM

## 2019-11-19 DIAGNOSIS — F321 Major depressive disorder, single episode, moderate: Secondary | ICD-10-CM | POA: Diagnosis not present

## 2019-11-19 MED ORDER — BUSPIRONE HCL 10 MG PO TABS: 10.0000 mg | ORAL_TABLET | Freq: Three times a day (TID) | ORAL | 1 refills | Status: AC

## 2019-11-19 MED ORDER — DONEPEZIL HCL 5 MG PO TABS: 5.0000 mg | ORAL_TABLET | Freq: Every day | ORAL | 1 refills | Status: AC

## 2019-11-19 MED ORDER — CITALOPRAM HYDROBROMIDE 20 MG PO TABS: 20.0000 mg | ORAL_TABLET | Freq: Every day | ORAL | 1 refills | Status: AC

## 2019-11-19 MED ORDER — MEMANTINE HCL 5 MG PO TABS: 5.0000 mg | ORAL_TABLET | Freq: Every day | ORAL | 1 refills | Status: AC

## 2019-11-19 NOTE — Progress Notes (Signed)
Discharge Note:   Pt discharged at 11:25; left with her son to go home; upon discharge pt is alert and oriented to person, but not to time, place, or situation; discharge instructions given to pt's son, her caregiver, includes her follow up appointments, her discharge prescriptions for medications, and discharge medication education. Pt denies suicidal and homicidal ideation, denies hallucinations, denies feelings of depression and anxiety. All personal belongings returned to pt upon discharge.

## 2019-11-19 NOTE — Progress Notes (Signed)
Recreation Therapy Notes  Date: 11/19/2019  Time: 9:30 am  Location: Craft room   Behavioral response: Appropriate  Intervention Topic: Relaxation    Discussion/Intervention:  Group content today was focused on relaxation. The group defined relaxation and identified healthy ways to relax. Individuals expressed how much time they spend relaxing. Patients expressed how much their life would be if they did not make time for themselves to relax. The group stated ways they could improve their relaxation techniques in the future.  Individuals participated in the intervention "Time to Relax" where they had a chance to experience different relaxation techniques.  Clinical Observations/Feedback:  Patient came to group identified coloring, going for a walk and spending time with her cat and dog as ways she relaxes. She expressed that relaxation is important to make time for peace. Individual was social with peers and staff while participating in the intervention. Jakell Trusty LRT/CTRS         Shalise Rosado 11/19/2019 10:57 AM

## 2019-11-19 NOTE — [facility] Suicide Risk Assessment (Signed)
[facility] INPATIENT:  Family/Significant Other Suicide Prevention Education  Suicide Prevention Education:  Contact Attempts: Madisin Hasan, son [phone], has been identified by the patient as the family member/significant other with whom the patient will be residing, and identified as the person(s) who will aid the patient in the event of a mental health crisis.  With written consent from the patient, two attempts were made to provide suicide prevention education, prior to and/or following the patient's discharge.  We were unsuccessful in providing suicide prevention education.  A suicide education pamphlet was given to the patient to share with family/significant other.  Date and time of first attempt: 11/18/2019 at 10:25AM Date and time of second attempt: 11/19/2019 at 9:24AM  CSW left HIPAA compliant voicemial.  Harden Mo 11/19/2019, 9:23 AM

## 2019-11-19 NOTE — BHH Suicide Risk Assessment (Signed)
[facility] Discharge Suicide Risk Assessment   Principal Problem: Episode of moderate major depression (HCC) Discharge Diagnoses: Principal Problem:   Episode of moderate major depression (HCC) Active Problems:   Dementia (HCC)   Total Time spent with patient: 30 minutes  Musculoskeletal: Strength & Muscle Tone: within normal limits Gait & Station: normal Patient leans: N/A  Psychiatric Specialty Exam: Review of Systems  Constitutional: Negative.   HENT: Negative.   Eyes: Negative.   Respiratory: Negative.   Cardiovascular: Negative.   Gastrointestinal: Negative.   Musculoskeletal: Negative.   Skin: Negative.   Neurological: Negative.   Psychiatric/Behavioral: Negative.        Patient has fairly severe memory problems especially short-term memory and she is aware of it    Blood pressure (!) 122/57, pulse 70, temperature 98.5 F (36.9 C), temperature source Oral, resp. rate 18, height 5\' 7"  (1.702 m), weight 68.5 kg, SpO2 97 %.Body mass index is 23.65 kg/m.  General Appearance: Casual  Eye Contact::  Good  Speech:  Clear and Coherent409  Volume:  Normal  Mood:  Euthymic  Affect:  Congruent  Thought Process:  Goal Directed  Orientation:  Full (Time, Place, and Person)  Thought Content:  Logical  Suicidal Thoughts:  No  Homicidal Thoughts:  No  Memory:  Immediate;   Fair Recent;   Poor Remote;   Poor  Judgement:  Fair  Insight:  Fair  Psychomotor Activity:  Normal  Concentration:  Fair  Recall:  Poor  Fund of Knowledge:Fair  Language: Fair  Akathisia:  No  Handed:  Right  AIMS (if indicated):     Assets:  Desire for Improvement Financial Resources/Insurance Housing Resilience Social Support  Sleep:  Number of Hours: 7.45  Cognition: Impaired,  Mild  ADL's:  Impaired   Mental Status Per Nursing Assessment::   On Admission:  NA  Demographic Factors:  Caucasian  Loss Factors: Financial problems/change in socioeconomic status  Historical  Factors: NA  Risk Reduction Factors:   Sense of responsibility to family, Living with another person, especially a relative, Positive social support and Positive therapeutic relationship  Continued Clinical Symptoms:  Depression:   Insomnia  Cognitive Features That Contribute To Risk:  Thought constriction (tunnel vision)    Suicide Risk:  Minimal: No identifiable suicidal ideation.  Patients presenting with no risk factors but with morbid ruminations; may be classified as minimal risk based on the severity of the depressive symptoms    Plan Of Care/Follow-up recommendations:  Activity:  Activity as tolerated Diet:  Regular diet Other:  Follow-up with outpatient treatment through the [facility] system  [facility], MD 11/19/2019, 10:05 AM

## 2019-11-19 NOTE — Progress Notes (Signed)
Patient has been pleasant. Appears less anxious. Asked to talk to her son once on the phone. Up once during the night asking where her room was and was redirectable. Denies SI, HI and AVH

## 2019-11-19 NOTE — Plan of Care (Signed)
  Problem: Education: Goal: Knowledge of [facility] Education information/materials will improve Outcome: Progressing Goal: Emotional status will improve Outcome: Progressing Goal: Mental status will improve Outcome: Progressing Goal: Verbalization of understanding the information provided will improve Outcome: Progressing   

## 2019-11-19 NOTE — Discharge Summary (Signed)
Physician Discharge Summary Note  Patient:  Terri Chandler is an 64 y.o., female MRN:  [id number] DOB:  09/03/1955 Patient phone:  [phone] (home)  Patient address:   [address] [place] [address],  Total Time spent with patient: 30 minutes  Date of Admission:  11/14/2019 Date of Discharge: 11/19/2019  Reason for Admission: Patient was admitted after presenting to the emergency room with symptoms of depression and reports of some suicidal thoughts.  Principal Problem: Episode of moderate major depression (HCC) Discharge Diagnoses: Principal Problem:   Episode of moderate major depression (HCC) Active Problems:   Dementia (HCC)   Past Psychiatric History: Patient has a history of dementia of unclear etiology as well as recurrent anxiety and depression.  She is followed up through the [place] system.  Past Medical History: History reviewed. No pertinent past medical history. History reviewed. No pertinent surgical history. Family History: History reviewed. No pertinent family history. Family Psychiatric  History: She reports that she had an aunt who had early onset dementia Social History:  Social History   Substance and Sexual Activity  Alcohol Use None     Social History   Substance and Sexual Activity  Drug Use Not on file    Social History   Socioeconomic History  . Marital status: Divorced    Spouse name: Not on file  . Number of children: Not on file  . Years of education: Not on file  . Highest education level: Not on file  Occupational History  . Not on file  Tobacco Use  . Smoking status: Current Every Day Smoker  . Smokeless tobacco: Never Used  Substance and Sexual Activity  . Alcohol use: Not on file  . Drug use: Not on file  . Sexual activity: Not on file  Other Topics Concern  . Not on file  Social History Narrative  . Not on file   Social Determinants of Health   Financial Resource Strain:   . Difficulty of Paying Living Expenses:   Food  Insecurity:   . Worried About Programme researcher, broadcasting/film/video in the Last Year:   . Barista in the Last Year:   Transportation Needs:   . Freight forwarder (Medical):   Marland Kitchen Lack of Transportation (Non-Medical):   Physical Activity:   . Days of Exercise per Week:   . Minutes of Exercise per Session:   Stress:   . Feeling of Stress :   Social Connections:   . Frequency of Communication with Friends and Family:   . Frequency of Social Gatherings with Friends and Family:   . Attends Religious Services:   . Active Member of Clubs or Organizations:   . Attends Banker Meetings:   Marland Kitchen Marital Status:     Hospital Course: Patient admitted to psychiatric ward.  15-minute checks maintained.  Patient did not display dangerous violence or suicidal behavior.  She was generally cooperative with treatment.  It was quite obvious that her memory especially her short-term memory was very impaired.  She was unable to remember conversations or plans for any significant length of time and as a result frequently appeared confused asking the same questions repeatedly.  On 1 occasion she reported being "suicidal" transiently because of her anxiety but did not act out or show any dangerous behavior.  I have been in touch with her son who confirms that the patient does have a place to live and [facility] [place] [facility] [place].  Patient has been switched off of fluoxetine after insisting that that medicine was ineffective and is now on citalopram buspirone as well as introductory doses of Aricept and Namenda.  Tolerating these okay so far.  She will be given 30-day prescriptions and will be discharged today with follow-up through the [place].  Physical Findings: AIMS:  , ,  ,  ,    CIWA:    COWS:     Musculoskeletal: Strength & Muscle Tone: within normal limits Gait & Station: normal Patient leans: N/A  Psychiatric Specialty Exam: Physical Exam Vitals and  nursing note reviewed.  Constitutional:      Appearance: She is well-developed.  HENT:     Head: Normocephalic and atraumatic.  Eyes:     Conjunctiva/sclera: Conjunctivae normal.     Pupils: Pupils are equal, round, and reactive to light.  Cardiovascular:     Heart sounds: Normal heart sounds.  Pulmonary:     Effort: Pulmonary effort is normal.  Abdominal:     Palpations: Abdomen is soft.  Musculoskeletal:        General: Normal range of motion.     Cervical back: Normal range of motion.  Skin:    General: Skin is warm and dry.  Neurological:     General: No focal deficit present.     Mental Status: She is alert.  Psychiatric:        Attention and Perception: She is inattentive.        Mood and Affect: Mood is anxious.        Speech: Speech normal.        Behavior: Behavior normal.        Thought Content: Thought content normal.        Cognition and Memory: Memory is impaired. She exhibits impaired recent memory and impaired remote memory.        Judgment: Judgment is impulsive.     Review of Systems  Constitutional: Negative.   HENT: Negative.   Eyes: Negative.   Respiratory: Negative.   Cardiovascular: Negative.   Gastrointestinal: Negative.   Musculoskeletal: Negative.   Skin: Negative.   Neurological: Negative.   Psychiatric/Behavioral: Negative.     Blood pressure (!) 122/57, pulse 70, temperature 98.5 F (36.9 C), temperature source Oral, resp. rate 18, height 5\' 7"  (1.702 m), weight 68.5 kg, SpO2 97 %.Body mass index is 23.65 kg/m.  General Appearance: Casual  Eye Contact:  Good  Speech:  Slow  Volume:  Decreased  Mood:  Euthymic  Affect:  Constricted  Thought Process:  Disorganized  Orientation:  Full (Time, Place, and Person)  Thought Content:  Tangential  Suicidal Thoughts:  No  Homicidal Thoughts:  No  Memory:  Immediate;   Fair Recent;   Poor Remote;   Poor  Judgement:  Impaired  Insight:  Shallow  Psychomotor Activity:  Restlessness   Concentration:  Concentration: Fair  Recall:  of Knowledge:  Fair  Language:  Fair  Akathisia:  No  Handed:  Right  AIMS (if indicated):     Assets:  Desire for Improvement  ADL's:  Impaired  Cognition:  Impaired,  Mild  Sleep:  Number of Hours: 7.45     Have you used any form of tobacco in the last 30 days? (Cigarettes, Smokeless Tobacco, Cigars, and/or Pipes): Yes  Has this patient used any form of tobacco in the last 30 days? (Cigarettes, Smokeless Tobacco, Cigars, and/or Pipes) Yes, No  Blood Alcohol level:  Lab Results  Component Value Date   ETH <10 11/13/2019    Metabolic Disorder Labs:  No results found for: HGBA1C, MPG No results found for: PROLACTIN No results found for: CHOL, TRIG, HDL, CHOLHDL, VLDL, LDLCALC  See Psychiatric Specialty Exam and Suicide Risk Assessment completed by Attending Physician prior to discharge.  Discharge destination:  Home  Is patient on multiple antipsychotic therapies at discharge:  No   Has Patient had three or more failed trials of antipsychotic monotherapy by history:  No  Recommended Plan for Multiple Antipsychotic Therapies: NA  Discharge Instructions    Diet - low sodium heart healthy   Complete by: As directed    Increase activity slowly   Complete by: As directed      Allergies as of 11/19/2019   Not on File     Medication List    TAKE these medications     Indication  busPIRone 10 MG tablet Commonly known as: BUSPAR Take 1 tablet (10 mg total) by mouth 3 (three) times daily.  Indication: Major Depressive Disorder   citalopram 20 MG tablet Commonly known as: CELEXA Take 1 tablet (20 mg total) by mouth daily. Start taking on: November 20, 2019  Indication: Depression, Generalized Anxiety Disorder   donepezil 5 MG tablet Commonly known as: ARICEPT Take 1 tablet (5 mg total) by mouth at bedtime.  Indication: Alzheimer's Disease   memantine 5 MG tablet Commonly known as: NAMENDA Take 1 tablet  (5 mg total) by mouth daily. Start taking on: November 20, 2019  Indication: Alzheimer's Disease, Cognitive Dysfunction        Follow-up recommendations:  Activity:  Activity as tolerated Diet:  Regular diet Other:  Outpatient follow-up as directed through the [place]  Comments: Prescriptions given at discharge  Signed: Mordecai Rasmussen, MD 11/19/2019, 10:11 AM

## 2019-11-19 NOTE — Progress Notes (Signed)
  [facility] Adult Case Management Discharge Plan :  Will you be returning to the same living situation after discharge:  Yes,  pt's son reports pt can return to the motel with him. He is working on getting her an apartment. At discharge, do you have transportation home?: Yes,  Son will provide transportation. Do you have the ability to pay for your medications: Yes,  [facility]  Release of information consent forms completed and in the chart;  Patient's signature needed at discharge.  Patient to Follow up at:  Follow-up Information    [facility] Follow up.   Why: Your appointment is scheduled for 11/20/2019 at 10AM.  Thanks!! Contact information: [address] [place], [place] [address] Main number: [phone] Mental health: [phone] Fax:[phone]              Next level of care provider has access to [facility] Link:no  Safety Planning and Suicide Prevention discussed: Yes,  SPE completed with the patient.   Have you used any form of tobacco in the last 30 days? (Cigarettes, Smokeless Tobacco, Cigars, and/or Pipes): Yes  Has patient been referred to the Quitline?: Patient refused referral  Patient has been referred for addiction treatment: Pt. refused referral  Harden Mo, LCSW 11/19/2019, 10:18 AM

## 2019-11-19 NOTE — Progress Notes (Signed)
Recreation Therapy Notes  INPATIENT RECREATION TR PLAN  Patient Details Name: Liesl Simons MRN: [id number] DOB: 1955/05/12 Today's Date: 11/19/2019  Rec Therapy Plan Is patient appropriate for Therapeutic Recreation?: Yes Treatment times per week: at least 3 Estimated Length of Stay: 5-7 days TR Treatment/Interventions: Group participation (Comment)  Discharge Criteria Pt will be discharged from therapy if:: Discharged Treatment plan/goals/alternatives discussed and agreed upon by:: Patient/family  Discharge Summary Short term goals set: Patient will identify 3 positive coping skills to decrease depressive symptoms within 5 recreation therapy group sessions Short term goals met: Complete Progress toward goals comments: Groups attended Which groups?: Goal setting, Self-esteem, Other (Comment) (Relaxation) Reason goals not met: N/A Therapeutic equipment acquired: N/A Reason patient discharged from therapy: Discharge from hospital Pt/family agrees with progress & goals achieved: Yes Date patient discharged from therapy: 11/19/19   Beatric Fulop 11/19/2019, 11:32 AM

## 2019-11-21 LAB — METHYLMALONIC ACID, SERUM: Methylmalonic Acid, Quantitative: 293 nmol/L (ref 0–378)

## 2020-02-04 ENCOUNTER — Emergency Department
Admission: EM | Admit: 2020-02-04 | Discharge: 2020-02-04 | Disposition: A | Discharge status: Home / Self Care | Payer: No Typology Code available for payment source | Attending: Emergency Medicine | Admitting: Emergency Medicine

## 2020-02-04 DIAGNOSIS — B3731 Acute candidiasis of vulva and vagina: Secondary | ICD-10-CM

## 2020-02-04 DIAGNOSIS — F32A Depression, unspecified: Secondary | ICD-10-CM

## 2020-02-04 DIAGNOSIS — F419 Anxiety disorder, unspecified: Secondary | ICD-10-CM | POA: Diagnosis not present

## 2020-02-04 DIAGNOSIS — Z20822 Contact with and (suspected) exposure to covid-19: Secondary | ICD-10-CM | POA: Diagnosis not present

## 2020-02-04 DIAGNOSIS — B373 Candidiasis of vulva and vagina: Secondary | ICD-10-CM | POA: Insufficient documentation

## 2020-02-04 DIAGNOSIS — Z79899 Other long term (current) drug therapy: Secondary | ICD-10-CM | POA: Insufficient documentation

## 2020-02-04 DIAGNOSIS — F0390 Unspecified dementia without behavioral disturbance: Secondary | ICD-10-CM | POA: Diagnosis not present

## 2020-02-04 DIAGNOSIS — F329 Major depressive disorder, single episode, unspecified: Secondary | ICD-10-CM | POA: Diagnosis not present

## 2020-02-04 DIAGNOSIS — F17200 Nicotine dependence, unspecified, uncomplicated: Secondary | ICD-10-CM | POA: Insufficient documentation

## 2020-02-04 LAB — COMPREHENSIVE METABOLIC PANEL
ALT: 15 U/L (ref 0–44)
AST: 19 U/L (ref 15–41)
Albumin: 4.8 g/dL (ref 3.5–5.0)
Alkaline Phosphatase: 63 U/L (ref 38–126)
Anion gap: 11 (ref 5–15)
BUN: <5 mg/dL — ABNORMAL LOW (ref 8–23)
CO2: 21 mmol/L — ABNORMAL LOW (ref 22–32)
Calcium: 9.3 mg/dL (ref 8.9–10.3)
Chloride: 105 mmol/L (ref 98–111)
Creatinine, Ser: 0.62 mg/dL (ref 0.44–1.00)
GFR, Estimated: >60 mL/min (ref >60)
Glucose, Bld: 98 mg/dL (ref 70–99)
Potassium: 3.6 mmol/L (ref 3.5–5.1)
Sodium: 137 mmol/L (ref 135–145)
Total Bilirubin: 0.6 mg/dL (ref 0.3–1.2)
Total Protein: 8.1 g/dL (ref 6.5–8.1)

## 2020-02-04 LAB — RESPIRATORY PANEL BY RT PCR (FLU A&B, COVID)
Influenza A by PCR: NEGATIVE
Influenza B by PCR: NEGATIVE
SARS Coronavirus 2 by RT PCR: NEGATIVE

## 2020-02-04 LAB — URINE DRUG SCREEN, QUALITATIVE (ARMC ONLY)
Amphetamines, Ur Screen: NOT DETECTED
Barbiturates, Ur Screen: NOT DETECTED
Benzodiazepine, Ur Scrn: NOT DETECTED
Cannabinoid 50 Ng, Ur ~~LOC~~: NOT DETECTED
Cocaine Metabolite,Ur ~~LOC~~: NOT DETECTED
MDMA (Ecstasy)Ur Screen: NOT DETECTED
Methadone Scn, Ur: NOT DETECTED
Opiate, Ur Screen: NOT DETECTED
Phencyclidine (PCP) Ur S: NOT DETECTED
Tricyclic, Ur Screen: NOT DETECTED

## 2020-02-04 LAB — SALICYLATE LEVEL: Salicylate Lvl: <7 mg/dL — ABNORMAL LOW (ref 7.0–30.0)

## 2020-02-04 LAB — WET PREP, GENITAL
Clue Cells Wet Prep HPF POC: NONE SEEN
Sperm: NONE SEEN
Trich, Wet Prep: NONE SEEN

## 2020-02-04 LAB — CBC
HCT: 44.6 % (ref 36.0–46.0)
Hemoglobin: 15.4 g/dL — ABNORMAL HIGH (ref 12.0–15.0)
MCH: 31 pg (ref 26.0–34.0)
MCHC: 34.5 g/dL (ref 30.0–36.0)
MCV: 89.9 fL (ref 80.0–100.0)
Platelets: 290 10*3/uL (ref 150–400)
RBC: 4.96 MIL/uL (ref 3.87–5.11)
RDW: 12.4 % (ref 11.5–15.5)
WBC: 7.6 10*3/uL (ref 4.0–10.5)
nRBC: 0 % (ref 0.0–0.2)

## 2020-02-04 LAB — ETHANOL: Alcohol, Ethyl (B): <10 mg/dL (ref <10)

## 2020-02-04 LAB — CHLAMYDIA/NGC RT PCR (ARMC ONLY)
Chlamydia Tr: NOT DETECTED
N gonorrhoeae: NOT DETECTED

## 2020-02-04 LAB — ACETAMINOPHEN LEVEL: Acetaminophen (Tylenol), Serum: <10 ug/mL — ABNORMAL LOW (ref 10–30)

## 2020-02-04 MED ORDER — FLUCONAZOLE 50 MG PO TABS
150.0000 mg | ORAL_TABLET | Freq: Once | ORAL | Status: AC
  Administered 2020-02-04: 150 mg via ORAL
  Filled 2020-02-04: qty 1

## 2020-02-04 NOTE — Evaluation (Signed)
Physical Therapy Evaluation Patient Details Name: Terri Chandler MRN: [id number] DOB: 1955-08-30 Today's Date: 02/04/2020   History of Present Illness  presented to ER secondary to anxiety, depression, possible GIB (no active bleeding noted per exam), limited access to medications in recent weeks  Clinical Impression  Upon evaluation, patient alert and oriented; follows commands and agreeable to session, stating "I'm glad I get to get up and move around".  Bilat UE/LE strength and ROM grossly symmetrical and WFL; no focal weakness appreciated.  Able to complete sit/stand, basic transfers and gait (200') without assist device, indep.  Demonstrates reciprocal stepping pattern; good step height/length, fair/good cadence, trunk rotation and arm swing.  Able to complete start/stop, head turns, changes of direction and speed modulation without difficulty. Appears to be at baseline level of functional ability; no acute PT needs identified.  Will complete order at this time; please re-consult should needs change.    Follow Up Recommendations No PT follow up    Equipment Recommendations       Recommendations for Other Services       Precautions / Restrictions Precautions Precautions: None Restrictions Weight Bearing Restrictions: No      Mobility  Bed Mobility               General bed mobility comments: seated edge of bed beginning/end of treatment session    Transfers Overall transfer level: Independent Equipment used: None                Ambulation/Gait Ambulation/Gait assistance: Independent Gait Distance (Feet): 200 Feet Assistive device: None       General Gait Details: reciprocal stepping pattern; good step height/length, fair/good cadence, trunk rotation and arm swing.  Able to complete start/stop, head turns, changes of direction and speed modulation without difficulty.  Stairs            Wheelchair Mobility    Modified Rankin (Stroke Patients  Only)       Balance Overall balance assessment: Independent                                           Pertinent Vitals/Pain Pain Assessment: No/denies pain    Home Living Family/patient expects to be discharged to:: Private residence Living Arrangements: Children Available Help at Discharge: Family;Available PRN/intermittently Type of Home: House Home Access: Stairs to enter   [employer] of Steps: 1 Home Layout: One level Home Equipment: None      Prior Function Level of Independence: Independent         Comments: Indep with ADLs, household and community mobilization; denies fall history.     Hand Dominance        Extremity/Trunk Assessment   Upper Extremity Assessment Upper Extremity Assessment: Overall WFL for tasks assessed    Lower Extremity Assessment Lower Extremity Assessment: Overall WFL for tasks assessed       Communication   Communication: No difficulties  Cognition Arousal/Alertness: Awake/alert Behavior During Therapy: WFL for tasks assessed/performed Overall Cognitive Status: Within Functional Limits for tasks assessed                                 General Comments: oriented to self, location and situation ("I just had trouble with my mind; haven't been able to get my medications")      General Comments  Exercises     Assessment/Plan    PT Assessment Patent does not need any further PT services  PT Problem List         PT Treatment Interventions      PT Goals (Current goals can be found in the Care Plan section)  Acute Rehab PT Goals Patient Stated Goal: for my son to come pick me up PT Goal Formulation: All assessment and education complete, DC therapy Time For Goal Achievement: 02/04/20 Potential to Achieve Goals: Good    Frequency     Barriers to discharge        Co-evaluation               AM-PAC PT "6 Clicks" Mobility  Outcome Measure Help needed turning  from your back to your side while in a flat bed without using bedrails?: None Help needed moving from lying on your back to sitting on the side of a flat bed without using bedrails?: None Help needed moving to and from a bed to a chair (including a wheelchair)?: None Help needed standing up from a chair using your arms (e.g., wheelchair or bedside chair)?: None Help needed to walk in hospital room?: None Help needed climbing 3-5 steps with a railing? : None 6 Click Score: 24    End of Session   Activity Tolerance: Patient tolerated treatment well Patient left: in bed   PT Visit Diagnosis: Difficulty in walking, not elsewhere classified (R26.2)    Time: 2703-5009 PT Time Calculation (min) (ACUTE ONLY): 10 min   Charges:   PT Evaluation $PT Eval Low Complexity: 1 Low         Seraphine Gudiel H. Manson Passey, PT, DPT, NCS 02/04/20, 4:18 PM [phone]

## 2020-02-04 NOTE — ED Provider Notes (Signed)
Patient seen and cleared by Psychiatry. Social work has also been involved and had extensive discussion with son, who seems very reasonable, caring, and has a firm grasp on her medication and treatment plan. Son has been supportive and states that pt has a h/o increasing paranoia, often gets scared when alone and son is working. He has plans to take pt to [place] for re-eval and discussion of additional resources at home. Given pt's otherwise well appearance, stable labs/vitals, no apparent indication for admission. D/c home with son.   Shaune Pollack, MD 02/04/20 [phone]

## 2020-02-04 NOTE — ED Notes (Addendum)
BIB [facility] from home c/o anxiety. Reports son took meds from her so she has not had any in 3 weeks. ST 110, 145/78. Pt reports feeling unsafe at home with EMS.  Visualized ambulating without difficulty in lobby.
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
## 2020-02-04 NOTE — ED Notes (Signed)
Needs placement.    Gilles Chiquito, MD 02/04/20 [phone]
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
## 2020-02-04 NOTE — TOC Initial Note (Signed)
Transition of Care [facility]) - Initial/Assessment Note    Patient Details  Name: Terri Chandler MRN: [id number] Date of Birth: July 01, 1955  [facility] CM/SW Contact:    Victorino Dike, RN Phone Number: 02/04/2020, 4:16 PM  Clinical Narrative:                  Met with patient and son separately.  Patient wants to go home with son, states she lives with him and they have a dog and cat.  She states she feels safe with son, she just doesn't understand why he doesn't provide medications.   Son arrived to Emergency Department, spoke with him in detail.  He described her medications, actions and how they worked.  He explained his mom has dementia and becomes scared and forgetful.    He is working with the [place] in [place] to help get her caregivers or [place].  [place], number to reach her is [phone].    He states his mother will get extremely frustrated and worried and her behavior will escalate in to scared.  He usually will sit and talk with her and this calms her.    Barriers to Discharge: Barriers Resolved   Patient Goals and CMS Choice   CMS Medicare.gov Compare Post Acute Care list provided to:: Patient Choice offered to / list presented to : Patient   Admission diagnosis:  anxiety ems Patient Active Problem List   Diagnosis Date Noted  . Severe recurrent major depression without psychotic features ([place]) 11/14/2019  . Episode of moderate major depression ([place]) 11/14/2019  . Dementia ([place]) 11/14/2019  . Major depressive disorder, recurrent severe without psychotic features ([place]) 11/13/2019   PCP:  System, Provider Not In Pharmacy:  No Pharmacies Listed    Social Determinants of Health (SDOH) Interventions    Readmission Risk Interventions No flowsheet data found.

## 2021-01-16 IMAGING — CT CT HEAD W/O CM
3 series · 16 of 47 positions shown, 19 images · non-contrast
Comparison: None.

CLINICAL DATA: Mental status changes

EXAM:
CT HEAD WITHOUT CONTRAST
TECHNIQUE: Contiguous axial images were obtained from the base of the skull
through the vertex without intravenous contrast.

[Series 2: head wo · axial · 0.40mm/px · z∈[+1329,+1454]mm · 10 of 31 slices shown, 13 images]
[im 3/31  brain]
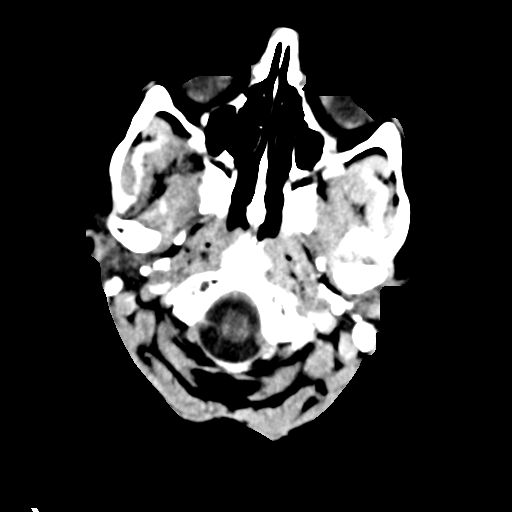
[im 3/31  bone]
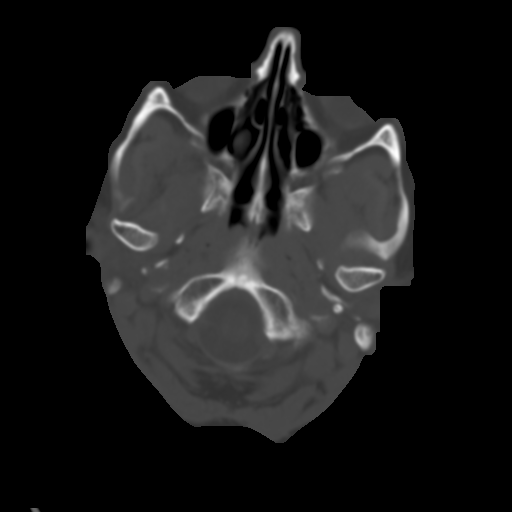
[im 6/31  brain]
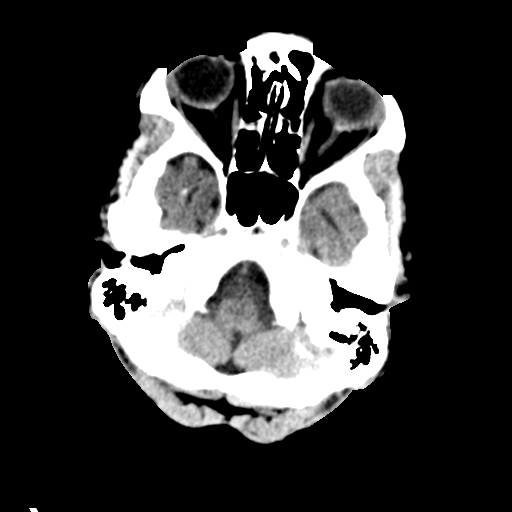
[im 9/31  brain]
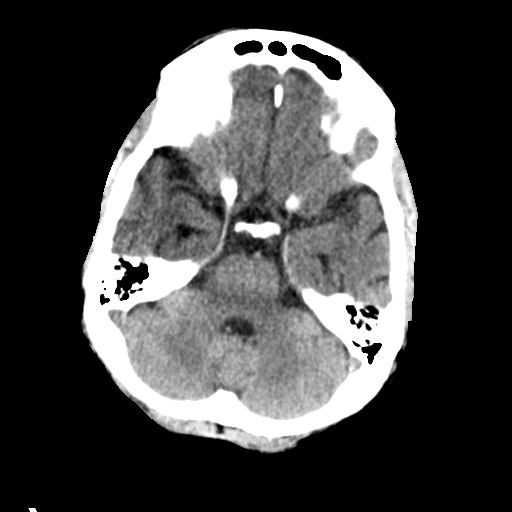
[im 11/31  brain]
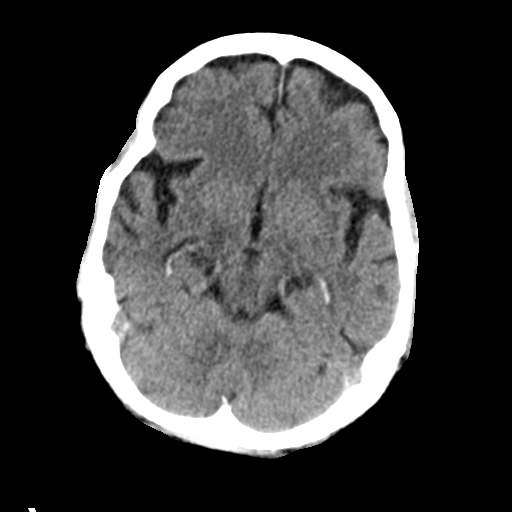
[im 14/31  brain]
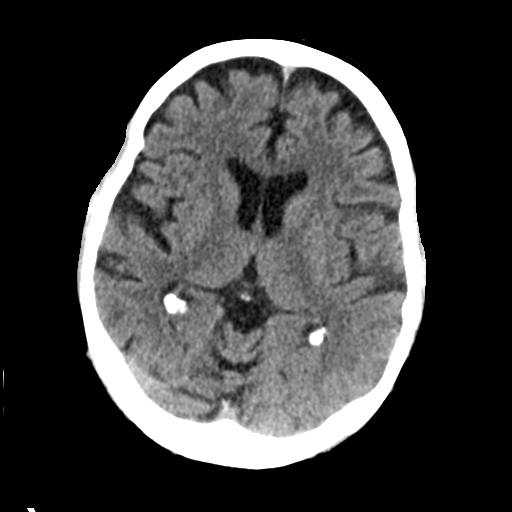
[im 14/31  bone]
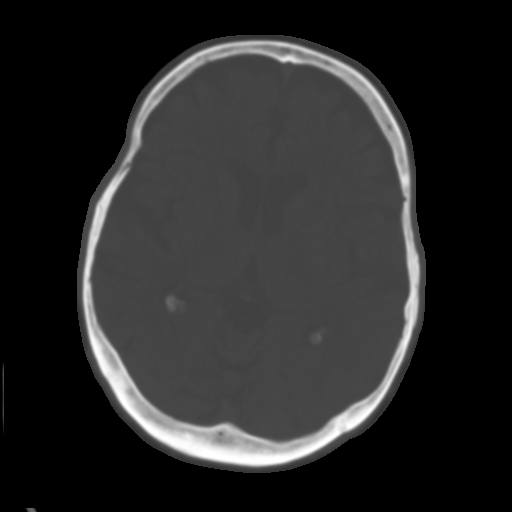
[im 17/31  brain]
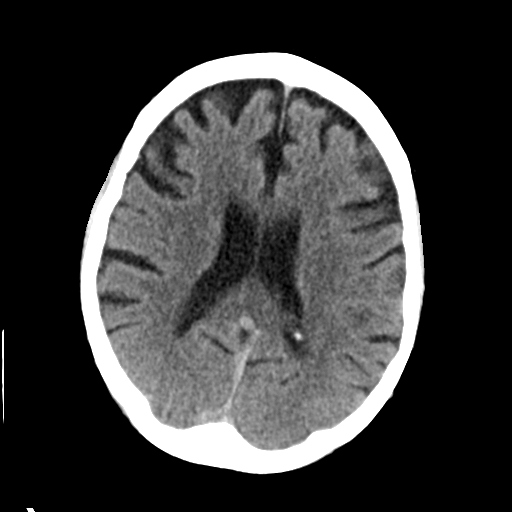
[im 20/31  brain]
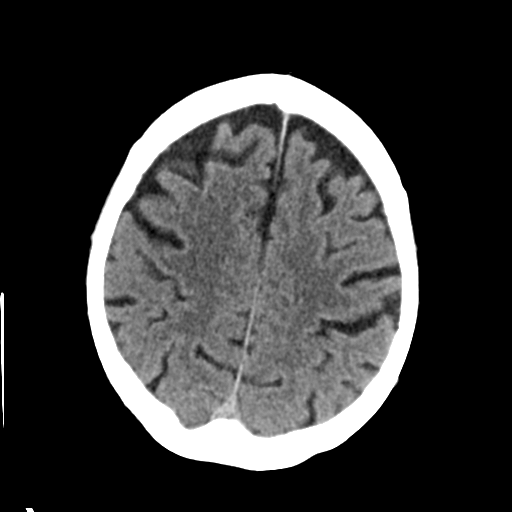
[im 23/31  brain]
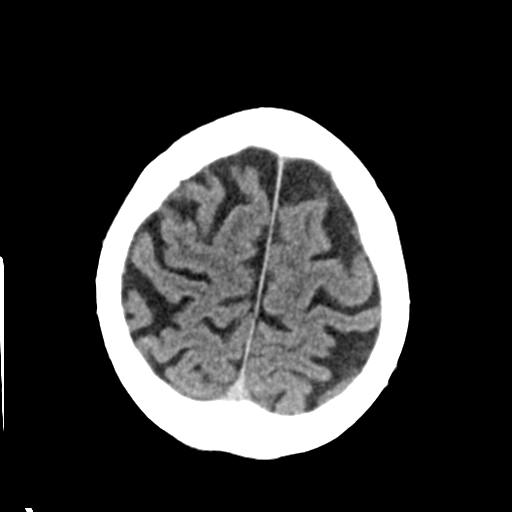
[im 25/31  brain]
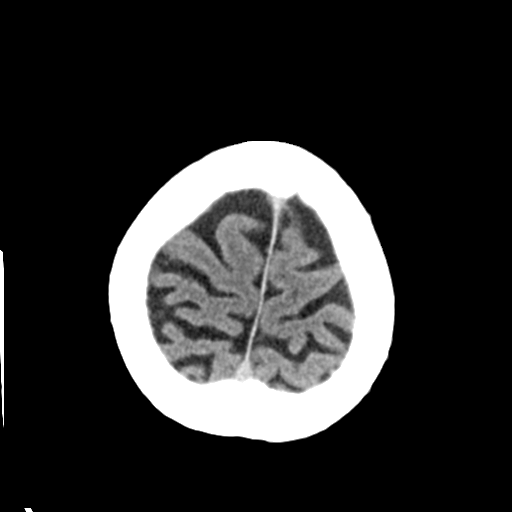
[im 25/31  bone]
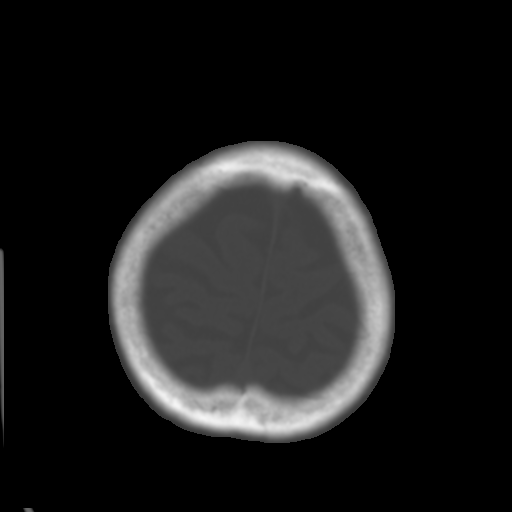
[im 28/31  brain]
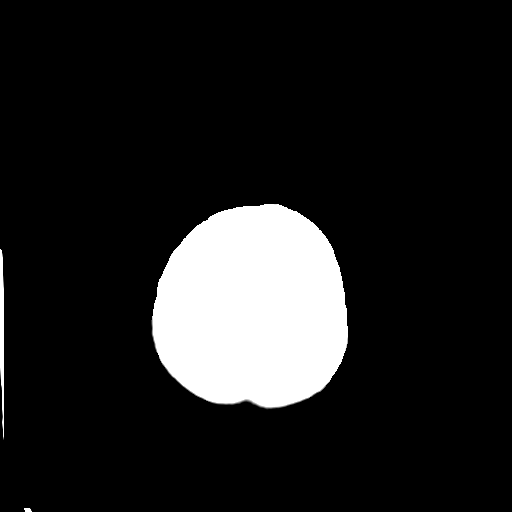

[Series 4: coronal soft tissue · coronal · 0.30mm/px · 3 of 66 slices shown]
[im 22/66  brain]
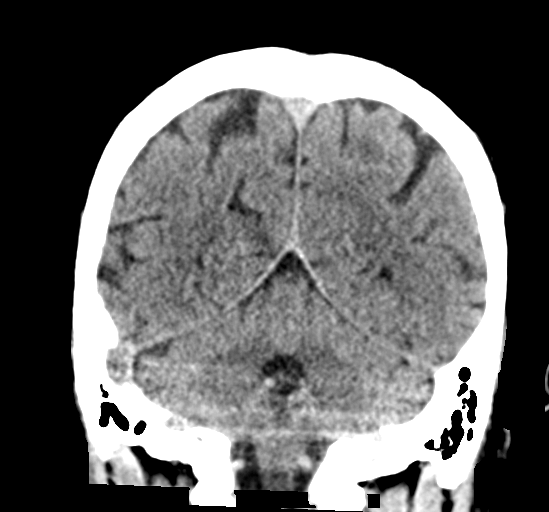
[im 29/66  brain]
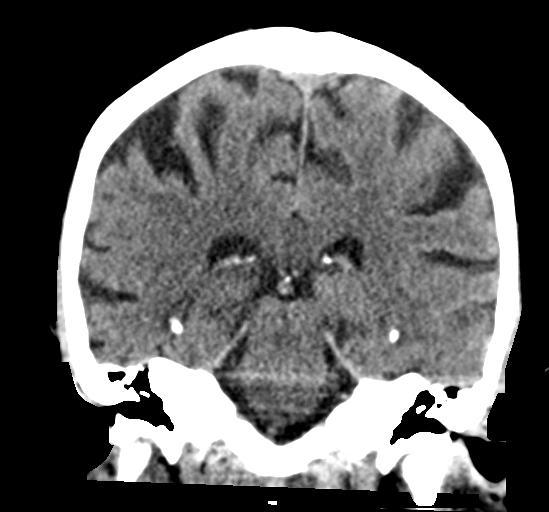
[im 37/66  brain]
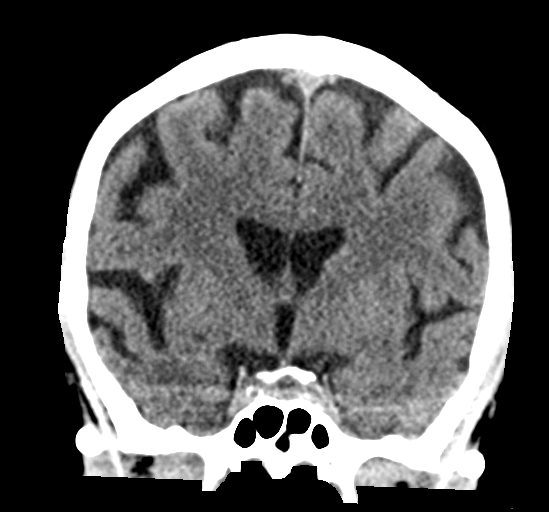

[Series 5: sagittal soft tissue · sagittal · 0.31mm/px · 3 of 55 slices shown]
[im 19/55  brain]
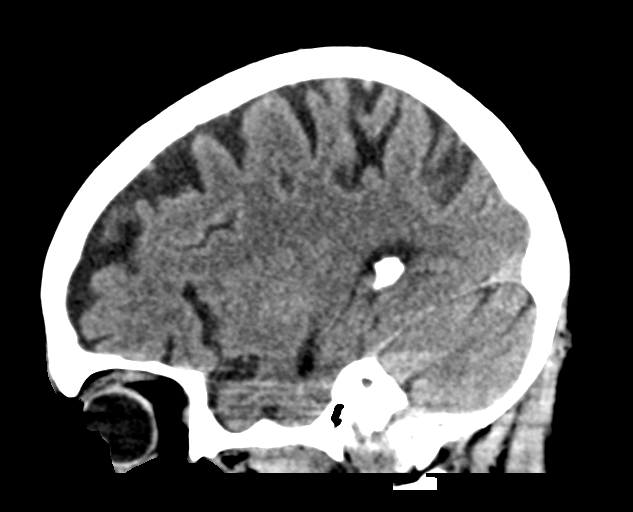
[im 28/55  brain]
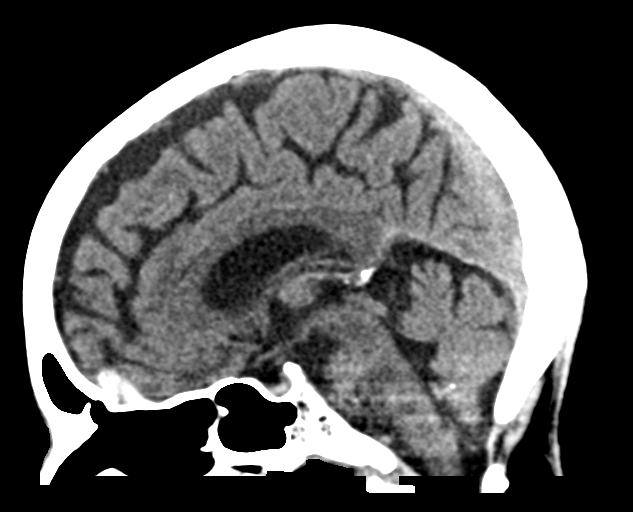
[im 37/55  brain]
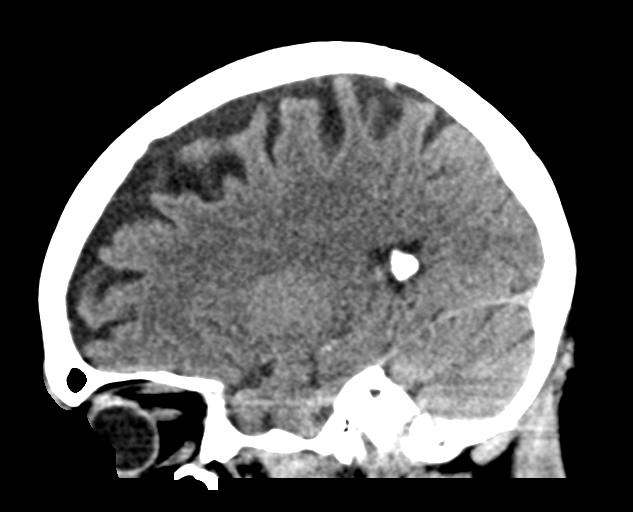

[16 of 47 positions shown; findings below may reference images not displayed]

FINDINGS: Brain: Diffuse cerebral atrophy. No acute intracranial abnormality.
Specifically, no hemorrhage, hydrocephalus, mass lesion, acute
infarction, or significant intracranial injury.

Vascular: No hyperdense vessel or unexpected calcification.

Skull: No acute calvarial abnormality.

Sinuses/Orbits: Visualized paranasal sinuses and mastoids clear.
Orbital soft tissues unremarkable.

Other: None
IMPRESSION: Diffuse cerebral atrophy, slightly advanced for age. No acute
intracranial abnormality.
